# Patient Record
Sex: Female | Born: 1935 | Race: White | Hispanic: No | Marital: Married | Smoking: Never smoker
Health system: Southern US, Community
[De-identification: ages and names within clinical notes are randomized; demographics above are authoritative.]

## PROBLEM LIST (undated history)

## (undated) DIAGNOSIS — F79 Unspecified intellectual disabilities: Secondary | ICD-10-CM

## (undated) DIAGNOSIS — K219 Gastro-esophageal reflux disease without esophagitis: Secondary | ICD-10-CM

## (undated) DIAGNOSIS — E785 Hyperlipidemia, unspecified: Secondary | ICD-10-CM

## (undated) DIAGNOSIS — E119 Type 2 diabetes mellitus without complications: Secondary | ICD-10-CM

## (undated) DIAGNOSIS — I10 Essential (primary) hypertension: Secondary | ICD-10-CM

## (undated) DIAGNOSIS — I2699 Other pulmonary embolism without acute cor pulmonale: Secondary | ICD-10-CM

## (undated) HISTORY — DX: Essential (primary) hypertension: I10

## (undated) HISTORY — DX: Gastro-esophageal reflux disease without esophagitis: K21.9

## (undated) HISTORY — DX: Other pulmonary embolism without acute cor pulmonale: I26.99

## (undated) HISTORY — DX: Type 2 diabetes mellitus without complications: E11.9

## (undated) HISTORY — DX: Unspecified intellectual disabilities: F79

## (undated) HISTORY — DX: Hyperlipidemia, unspecified: E78.5

---

## 2007-12-03 ENCOUNTER — Ambulatory Visit: Payer: Self-pay

## 2009-03-26 ENCOUNTER — Inpatient Hospital Stay: Payer: Self-pay | Admitting: Internal Medicine

## 2009-03-26 ENCOUNTER — Ambulatory Visit: Payer: Self-pay | Admitting: Cardiovascular Disease

## 2009-04-03 ENCOUNTER — Telehealth: Payer: Self-pay | Admitting: Cardiovascular Disease

## 2010-02-20 ENCOUNTER — Inpatient Hospital Stay: Payer: Self-pay | Admitting: Specialist

## 2010-02-23 LAB — PATHOLOGY REPORT

## 2011-03-11 ENCOUNTER — Ambulatory Visit: Payer: Self-pay | Admitting: Cardiovascular Disease

## 2011-05-04 ENCOUNTER — Emergency Department: Payer: Self-pay | Admitting: *Deleted

## 2011-05-16 ENCOUNTER — Emergency Department: Payer: Self-pay | Admitting: Emergency Medicine

## 2011-10-27 ENCOUNTER — Inpatient Hospital Stay: Payer: Self-pay | Admitting: Internal Medicine

## 2011-10-27 DIAGNOSIS — I2699 Other pulmonary embolism without acute cor pulmonale: Secondary | ICD-10-CM

## 2011-10-27 HISTORY — DX: Other pulmonary embolism without acute cor pulmonale: I26.99

## 2011-10-27 LAB — CK TOTAL AND CKMB (NOT AT ARMC): CK, Total: 57 U/L (ref 21–215)

## 2011-10-27 LAB — COMPREHENSIVE METABOLIC PANEL
Albumin: 4.2 g/dL (ref 3.4–5.0)
Alkaline Phosphatase: 53 U/L (ref 50–136)
Anion Gap: 13 (ref 7–16)
Bilirubin,Total: 0.3 mg/dL (ref 0.2–1.0)
Chloride: 96 mmol/L — ABNORMAL LOW (ref 98–107)
Creatinine: 0.7 mg/dL (ref 0.60–1.30)
Glucose: 281 mg/dL — ABNORMAL HIGH (ref 65–99)
Osmolality: 284 (ref 275–301)
SGOT(AST): 22 U/L (ref 15–37)
SGPT (ALT): 19 U/L
Sodium: 137 mmol/L (ref 136–145)
Total Protein: 8.5 g/dL — ABNORMAL HIGH (ref 6.4–8.2)

## 2011-10-27 LAB — APTT
Activated PTT: 28.6 secs (ref 23.6–35.9)
Activated PTT: 88.9 secs — ABNORMAL HIGH (ref 23.6–35.9)

## 2011-10-27 LAB — MAGNESIUM: Magnesium: 1.1 mg/dL — ABNORMAL LOW

## 2011-10-27 LAB — CBC
HCT: 44 % (ref 35.0–47.0)
HGB: 14.9 g/dL (ref 12.0–16.0)
MCV: 86 fL (ref 80–100)
RBC: 5.13 10*6/uL (ref 3.80–5.20)
RDW: 14.8 % — ABNORMAL HIGH (ref 11.5–14.5)

## 2011-10-27 LAB — LIPASE, BLOOD: Lipase: 126 U/L (ref 73–393)

## 2011-10-27 LAB — TROPONIN I: Troponin-I: <0.02 ng/mL

## 2011-10-28 LAB — CK TOTAL AND CKMB (NOT AT ARMC)
CK, Total: 53 U/L (ref 21–215)
CK, Total: 47 U/L (ref 21–215)
CK-MB: 0.9 ng/mL (ref 0.5–3.6)
CK-MB: 0.9 ng/mL (ref 0.5–3.6)

## 2011-10-28 LAB — COMPREHENSIVE METABOLIC PANEL
Alkaline Phosphatase: 38 U/L — ABNORMAL LOW (ref 50–136)
BUN: 11 mg/dL (ref 7–18)
Calcium, Total: 8.6 mg/dL (ref 8.5–10.1)
Co2: 28 mmol/L (ref 21–32)
EGFR (African American): >60
EGFR (Non-African Amer.): >60
Glucose: 76 mg/dL (ref 65–99)
Osmolality: 279 (ref 275–301)
Potassium: 3.5 mmol/L (ref 3.5–5.1)
SGOT(AST): 15 U/L (ref 15–37)
Sodium: 141 mmol/L (ref 136–145)

## 2011-10-28 LAB — CBC WITH DIFFERENTIAL/PLATELET
Basophil %: 0.4 %
Eosinophil #: 0.1 10*3/uL (ref 0.0–0.7)
Eosinophil %: 1.6 %
Lymphocyte %: 27.3 %
MCH: 28.8 pg (ref 26.0–34.0)
MCHC: 33 g/dL (ref 32.0–36.0)
Neutrophil #: 3.4 10*3/uL (ref 1.4–6.5)
Neutrophil %: 61.5 %
RDW: 14.9 % — ABNORMAL HIGH (ref 11.5–14.5)

## 2011-10-28 LAB — APTT: Activated PTT: 89.8 secs — ABNORMAL HIGH (ref 23.6–35.9)

## 2011-10-28 LAB — TROPONIN I: Troponin-I: <0.02 ng/mL

## 2011-11-10 ENCOUNTER — Emergency Department: Payer: Self-pay | Admitting: Emergency Medicine

## 2011-11-10 LAB — PROTIME-INR
INR: 1.9
Prothrombin Time: 21.8 secs — ABNORMAL HIGH (ref 11.5–14.7)

## 2011-11-10 LAB — CBC
HCT: 39.1 % (ref 35.0–47.0)
MCH: 29.1 pg (ref 26.0–34.0)
MCHC: 33.4 g/dL (ref 32.0–36.0)
Platelet: 299 10*3/uL (ref 150–440)
RDW: 14.6 % — ABNORMAL HIGH (ref 11.5–14.5)
WBC: 6.4 10*3/uL (ref 3.6–11.0)

## 2011-11-10 LAB — COMPREHENSIVE METABOLIC PANEL
Albumin: 3.8 g/dL (ref 3.4–5.0)
Alkaline Phosphatase: 73 U/L (ref 50–136)
Anion Gap: 11 (ref 7–16)
BUN: 18 mg/dL (ref 7–18)
Bilirubin,Total: 0.3 mg/dL (ref 0.2–1.0)
Co2: 28 mmol/L (ref 21–32)
Creatinine: 0.55 mg/dL — ABNORMAL LOW (ref 0.60–1.30)
EGFR (African American): >60
Glucose: 160 mg/dL — ABNORMAL HIGH (ref 65–99)
Osmolality: 281 (ref 275–301)
Potassium: 3.6 mmol/L (ref 3.5–5.1)
SGOT(AST): 17 U/L (ref 15–37)
SGPT (ALT): 19 U/L
Total Protein: 7.4 g/dL (ref 6.4–8.2)

## 2011-11-10 LAB — CK TOTAL AND CKMB (NOT AT ARMC): CK-MB: 1.4 ng/mL (ref 0.5–3.6)

## 2011-11-10 LAB — APTT: Activated PTT: 40.3 secs — ABNORMAL HIGH (ref 23.6–35.9)

## 2012-07-25 ENCOUNTER — Emergency Department: Payer: Self-pay

## 2012-07-25 LAB — URINALYSIS, COMPLETE
Bilirubin,UR: NEGATIVE
Hyaline Cast: 5
Ketone: NEGATIVE
Nitrite: NEGATIVE
Ph: 7 (ref 4.5–8.0)
Protein: NEGATIVE
Specific Gravity: 1.016 (ref 1.003–1.030)
Squamous Epithelial: <1
WBC UR: 2 /HPF (ref 0–5)

## 2012-07-25 LAB — CBC
HGB: 13.4 g/dL (ref 12.0–16.0)
MCH: 29 pg (ref 26.0–34.0)
MCHC: 33.9 g/dL (ref 32.0–36.0)
Platelet: 324 10*3/uL (ref 150–440)
RDW: 14.2 % (ref 11.5–14.5)

## 2012-07-25 LAB — CK TOTAL AND CKMB (NOT AT ARMC): CK, Total: 45 U/L (ref 21–215)

## 2012-07-25 LAB — COMPREHENSIVE METABOLIC PANEL
Albumin: 3.9 g/dL (ref 3.4–5.0)
Alkaline Phosphatase: 56 U/L (ref 50–136)
BUN: 12 mg/dL (ref 7–18)
Bilirubin,Total: 0.3 mg/dL (ref 0.2–1.0)
Chloride: 99 mmol/L (ref 98–107)
Creatinine: 0.79 mg/dL (ref 0.60–1.30)
Glucose: 157 mg/dL — ABNORMAL HIGH (ref 65–99)
Osmolality: 281 (ref 275–301)
SGOT(AST): 24 U/L (ref 15–37)
Sodium: 139 mmol/L (ref 136–145)

## 2012-07-25 LAB — TROPONIN I: Troponin-I: <0.02 ng/mL

## 2013-01-12 IMAGING — CR DG CHEST 2V
1 series · 2 of 2 positions shown · non-contrast
Comparison: none

REASON FOR EXAM: chest pain
COMMENTS:

PROCEDURE:     DXR - DXR CHEST PA (OR AP) AND LATERAL  - November 10, 2011 [DATE]
RESULT:     Comparison is made to the prior exam of 10/27/2011.
The lung fields are clear. No pneumonia, pneumothorax or pleural effusion is
seen. The heart size is normal. No acute bony abnormalities are seen.

[Series 1: w chest pa · 0.14mm/px · 2 of 2 slices shown]
[im 1/2]
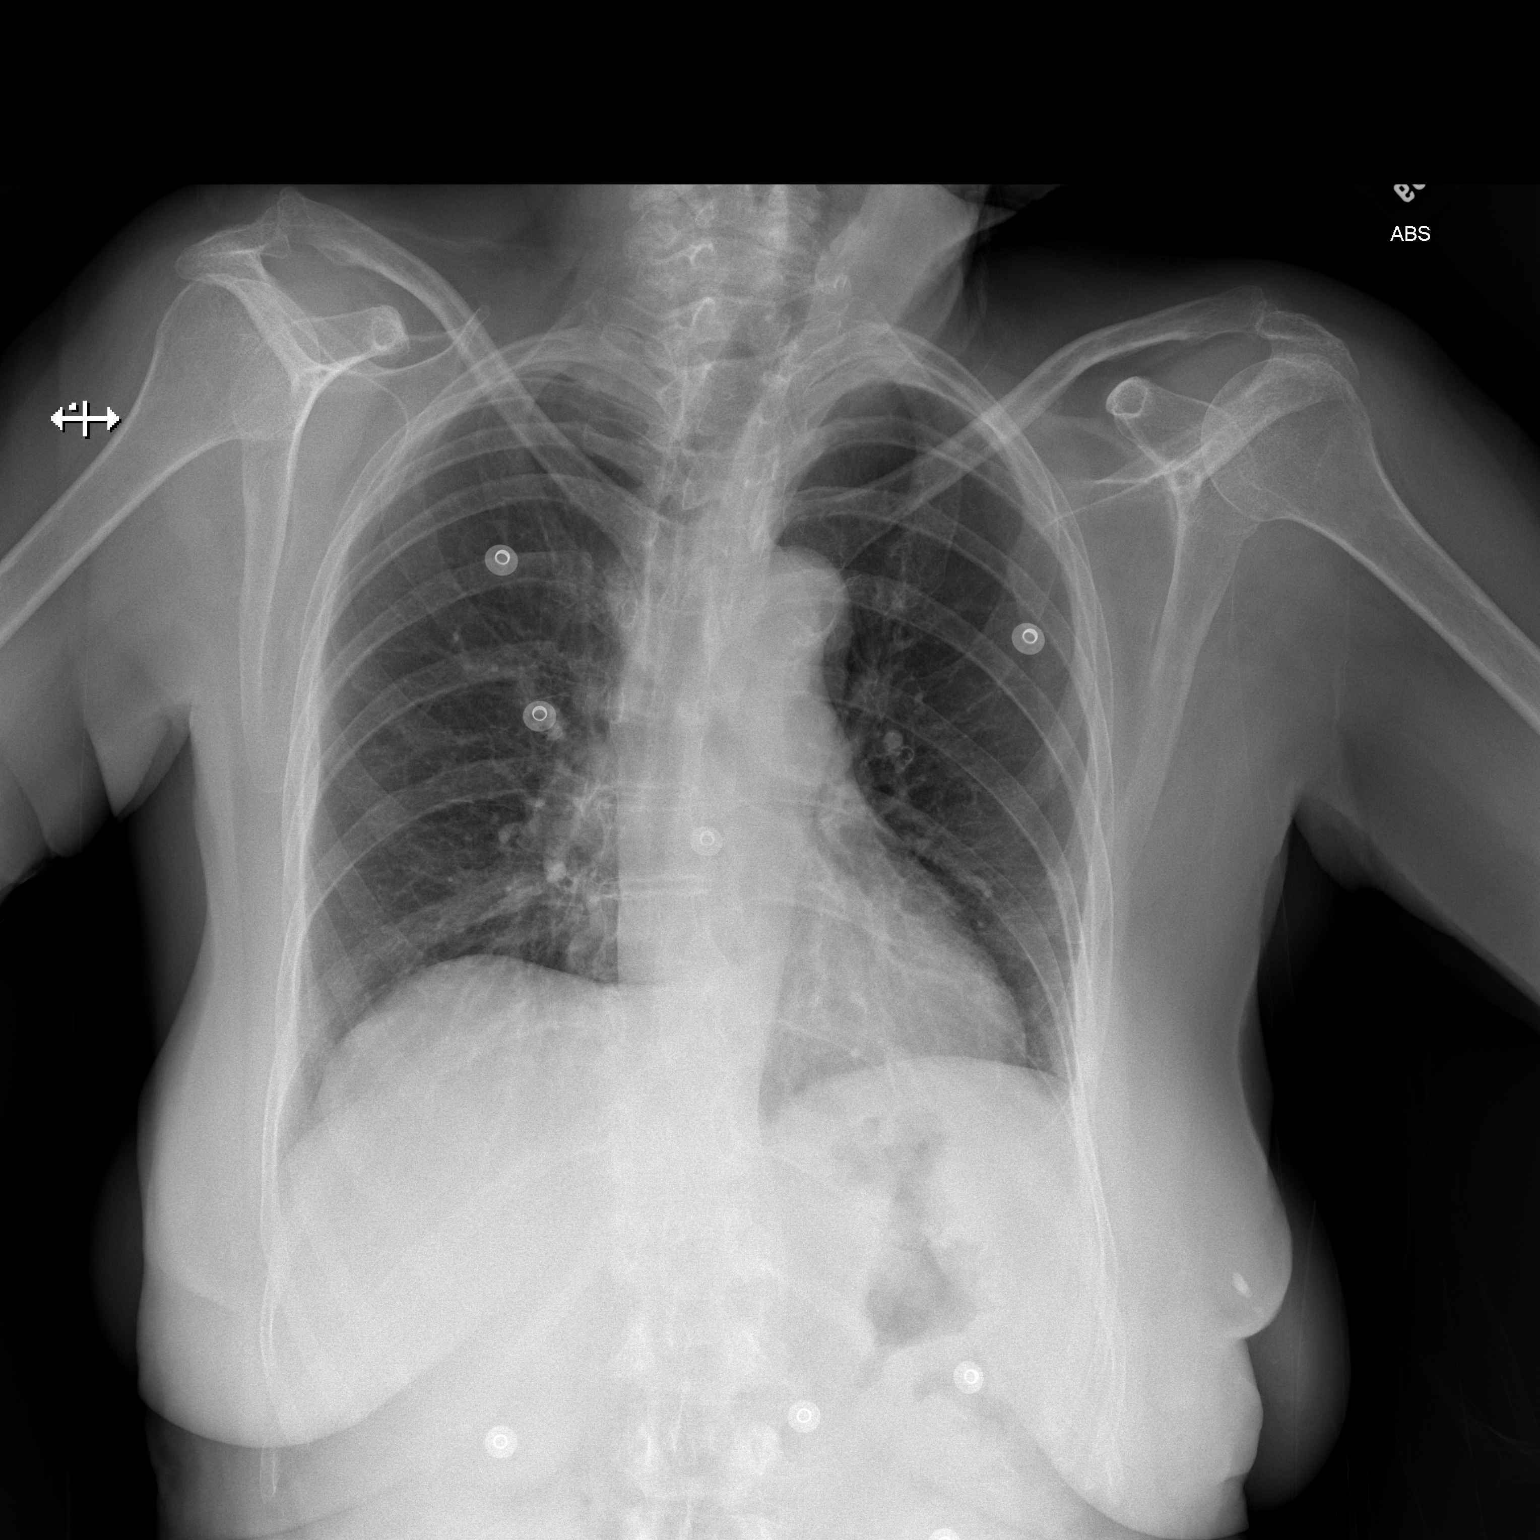
[im 2/2]
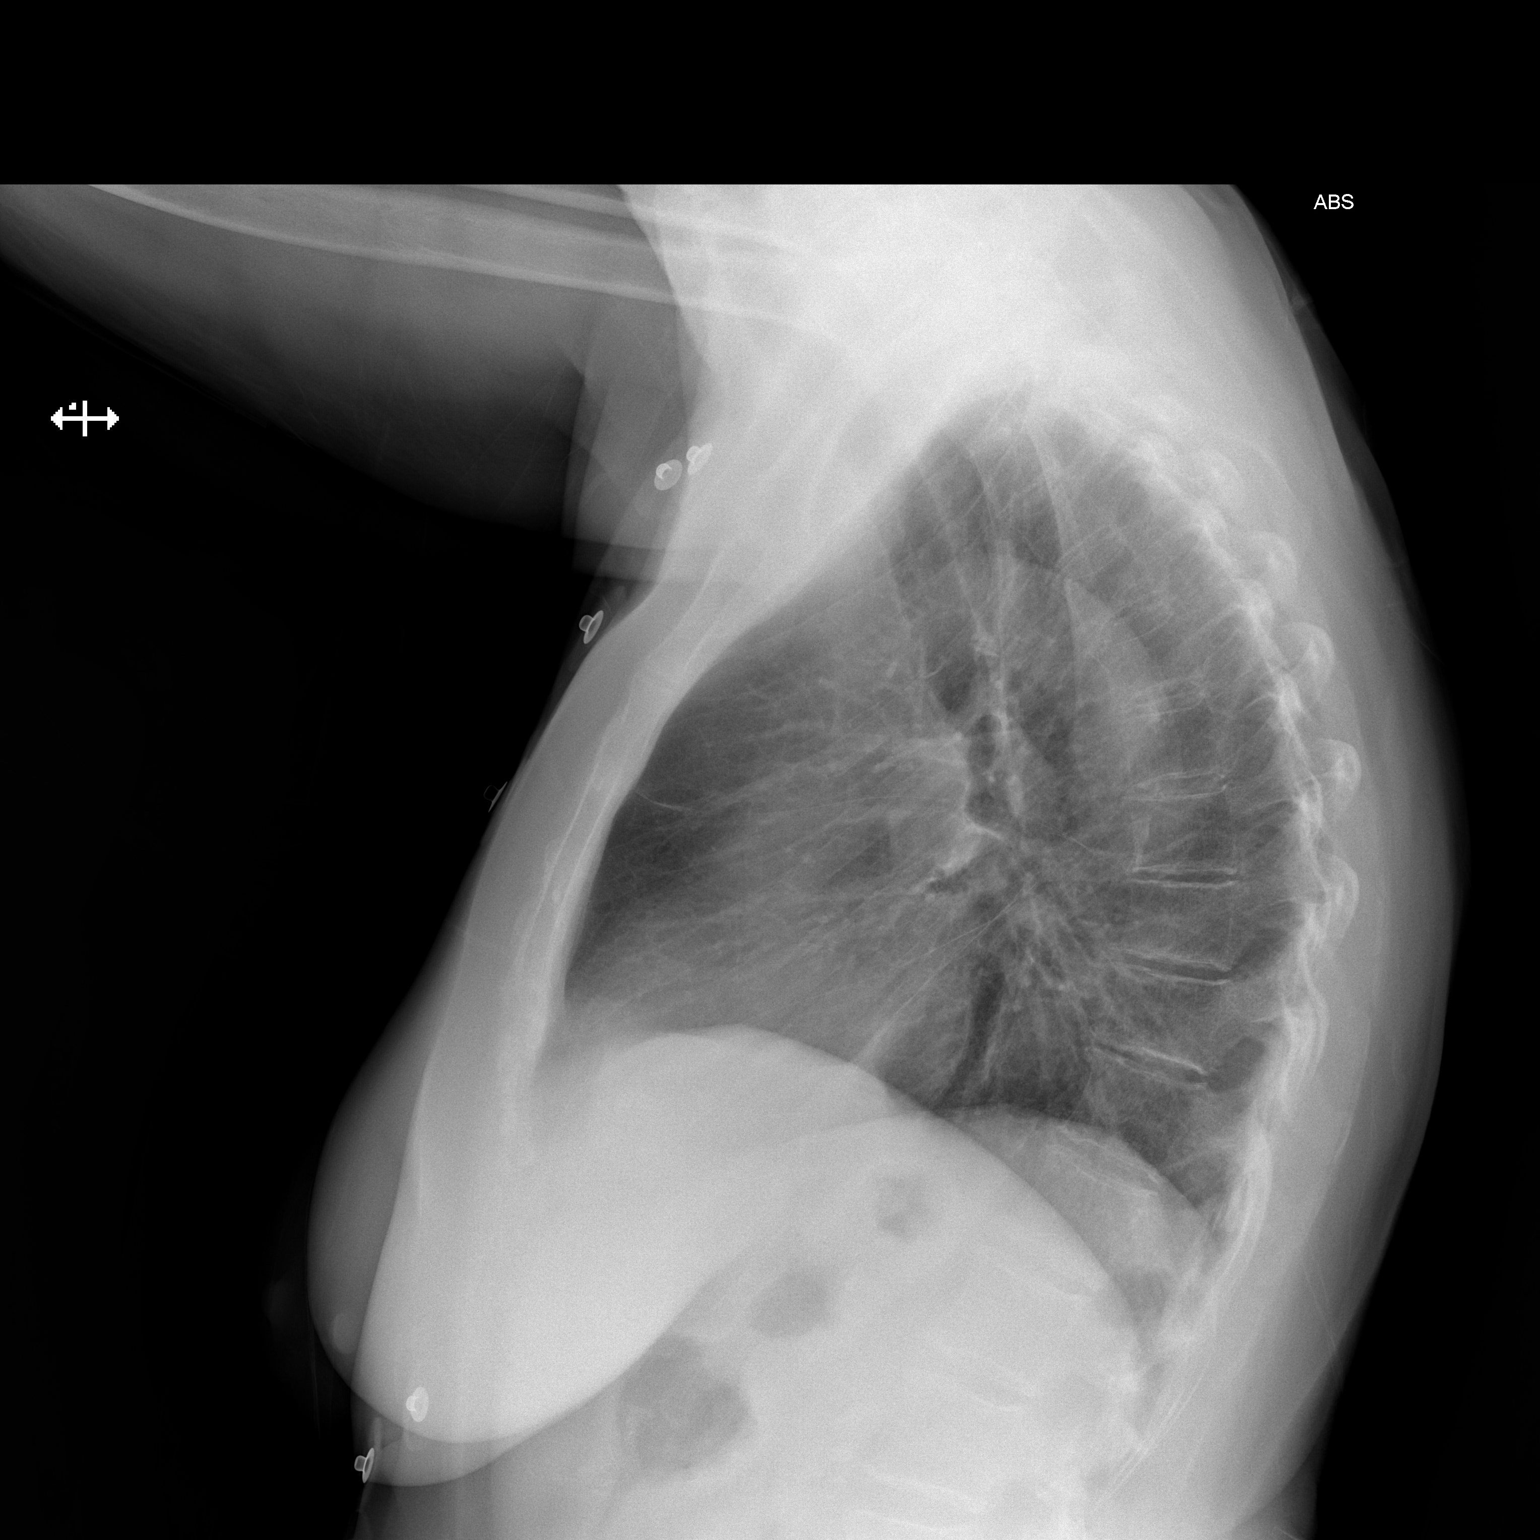

[2 of 2 positions shown; findings below may reference images not displayed]

IMPRESSION: No acute changes are identified.

## 2014-12-07 NOTE — Consult Note (Signed)
Chief Complaint:   Subjective/Chief Complaint Remains pleasently confused. No specific GI symptoms.  Recommendations: Continue bid PPI. Will hold off on EGD due to recent PE and discuss the long term plan with the primary team.   Electronic Signatures: Jill Side (MD)  (Signed 16-Mar-13 12:30)  Authored: Chief Complaint   Last Updated: 16-Mar-13 12:30 by Jill Side (MD)

## 2014-12-07 NOTE — Consult Note (Signed)
PATIENT NAME:  Jennifer Villarreal, Jennifer Villarreal MR#:  [id number] DATE OF BIRTH:  1930-12-10  DATE OF CONSULTATION:  10/28/2011  REFERRING PHYSICIAN:   CONSULTING PHYSICIAN:  Jill Side, MD  REASON FOR CONSULTATION: Abnormal esophagus on CT scan.   HISTORY OF PRESENT ILLNESS: This is a 81 year old female with history of pulmonary embolus in August 2010, hypertension, diabetes, and gastroesophageal reflux disease. She is mentally handicapped. She was admitted to the hospital with chest pain. The patient points towards retrosternal area, as the area of discomfort. Her description is very vague. CT scan of the abdomen was consistent or suggestive of pulmonary embolism and the patient was started on IV heparin and admitted. CT scan also showed some thickening of the wall of the mid to upper esophagus and an endoscopy was recommended. Good history is not available from the patient. She denies dysphagia or odynophagia.   PAST MEDICAL HISTORY:  1. History of hypertension. 2. Type 2 diabetes. 3. History of gastroesophageal reflux disease. 4. Pulmonary embolus in 2010.  5. History of displaced left femoral neck fracture.   HOME MEDICATIONS:  1. Dilantin. 2. Glimepiride. 3. Lorazepam. 4. Metformin. 5. Metoprolol. 6. Omeprazole.   ALLERGIES: None.   SOCIAL HISTORY: She lives at home. Her sister takes care of her.   FAMILY HISTORY: Not available.   REVIEW OF SYSTEMS: Positive for some chest discomfort, otherwise unremarkable.   PHYSICAL EXAMINATION:  GENERAL: Elderly female. She does not appear to be in any acute distress. She appears pleasant and cooperative, but pleasantly demented.    VITAL SIGNS: Pulse 76, temperature 98.7, respirations 20, blood pressure 184/90.    SKIN: A few small skin bruises were noted.   LUNGS: Grossly clear to auscultation bilaterally with fair air entry and no added sounds.   CARDIOVASCULAR: Regular rate and rhythm. No gallops or murmurs were heard.   ABDOMEN: Fairly  benign. Abdomen is soft. No rebound or guarding was noted. No hepatosplenomegaly. Bowel sounds are positive.   EXTREMITIES: No edema.   NEUROLOGIC: Except for her mental confusion, neurological examination appears to be unremarkable.   LABORATORY, DIAGNOSTIC, AND RADIOLOGICAL DATA: White cell count 5.5, hemoglobin 11.7, hematocrit 35.5, platelet count 238. Electrolytes are fairly unremarkable. Liver enzymes are normal. Creatinine 0.56. CT scan as above.   ASSESSMENT AND PLAN: The patient is with pulmonary embolus. CT also showed some thickening of the wall of the proximal and mid esophagus. This is most likely secondary to esophagitis and an esophageal malignancy is unlikely, although possible. I would increase her proton pump inhibitor from once a day to b.i.d. Once the patient's pulmonary status is more stable, we will we will proceed with an upper gastrointestinal endoscopy if agreed by the patient and her caretakers. Currently, the patient has no dysphagia or odynophagia and, therefore, an urgent upper gastrointestinal endoscopy is not required. We will follow and make further recommendations.  ____________________________ Jill Side, MD si:ap D: 10/28/2011 17:29:29 ET            T: 10/29/2011 09:41:12 ET              JOB#: [id number] cc: Jill Side, MD, <Dictator> Jill Side MD ELECTRONICALLY SIGNED 10/29/2011 11:25

## 2014-12-07 NOTE — Discharge Summary (Signed)
PATIENT NAME:  Jennifer Villarreal, Jennifer Villarreal MR#:  [id number] DATE OF BIRTH:  14-Aug-1931  DATE OF ADMISSION:  10/27/2011 DATE OF DISCHARGE:  10/30/2011  PRIMARY CARE PHYSICIAN: Morton Peters., MD    ADMITTING PHYSICIAN: Theodoro Grist, MD  DISCHARGING PHYSICIAN: Judeth Horn. Royden Purl, MD    ADMITTING DIAGNOSIS: Chest pain.   DISCHARGE DIAGNOSES:  1. Chest pain secondary to pulmonary embolus. 2. Mental retardation. 3. Hypertension. 4. Tachycardia from pulmonary embolus.  5. Diabetes.  6. Hypomagnesia. 7. Gastroesophageal reflux disease.  8. Thickening of the esophagus, possible esophagitis.   CONSULTANTS:  1. Case Management.  2. Physical Therapy.  3. Occupational Therapy.  4. Dr. Jill Side.   HOSPITAL LABORATORY, DIAGNOSTIC AND RADIOLOGICAL DATA:  Chest x-ray on 10/27/2011 showed no acute changes identified.  CT scan of the chest with contrast 10/27/2011 showed evaluation of segmental pulmonary arteries was limited by respiratory motion. However, there were findings consistent with small eccentric pulmonary embolus in the anterior left upper lobe segmental pulmonary, circumferential thickening of the proximal mid thoracic esophagus, nonspecific, and may be related to esophagitis.  Lower extremity Dopplers on 10/27/2010 showed no deep venous thrombosis identified in either side.  Echo Doppler 10/28/2011 done by Dr. Serafina Royals showed left ventricular systolic function is normal. Ejection fraction 55 %. There is mild mitral regurgitation. There is mild tricuspid regurgitation.   HOSPITAL COURSE: Initial History and Physical were done by Dr. Theodoro Grist.  Please refer to her note dated 10/27/2011 for complete details. In brief, the patient is a 81 year old white female with past medical history of pulmonary embolism in August 2010, history of hypertension, diabetes, as well as gastroesophageal reflux disease, who is mentally handicapped, presents with chest pain. The patient was found to  have pulmonary embolus on CT scan of the chest and was admitted to the Hospitalist Service.   1. Chest pain: Most likely this was secondary to pulmonary embolism. She was monitored on telemetry. She had no troponin leak. She was started on heparin nomogram and was eventually converted over to Xarelto. We confirmed with Case Management that her payments for Xarelto monthly would be $3.50, thus, we started her on this.  2. History of hypertension: We continued outpatient medications in the form of metoprolol, Diovan and hydrochlorothiazide.  3. Sinus tachycardia: This is was most likely secondary to pulmonary embolism. She had three sets of cardiac enzymes which showed no troponin leak. She was on metoprolol, which her symptoms eventually resolved.  4. Diabetes: We held her metformin considering CT scan. We continued her on glimepiride and sliding scale insulin. We resumed all of her home medications for diabetes.  5. Hypomagnesia: This was supplemented and repleted.  6. Gastroesophageal reflux disease with thickening of the esophagus: Likely esophagitis. We continued her PPI and increased it b.i.d.  She will need outpatient followup and possibly EGD by Dr. Dionne Milo in the future. She will follow up with him as an outpatient.  7. Deep venous thrombosis prophylaxis: This was maintained with heparin nomogram and was converted over to Xarelto.  8. The patient was seen by PT/OT and recommended for Home Health.   CONDITION ON DISCHARGE: She was discharged on 10/30/2011. VITAL SIGNS: Temperature 97.9, heart rate 69, respirations 20, blood pressure 125/77, saturating 94% on 2 liters. LUNGS: Clear to auscultation. CARDIOVASCULAR: Regular rate and rhythm. ABDOMEN: Benign.     DISCHARGE MEDICATIONS:  1. Diovan 40 mg daily.  2. Glimepiride 4 mg b.i.d.   3. Lorazepam 1 tablet b.i.d.   4.  Metoprolol ER 50 mg once a day. 5. Aspirin 81 mg daily. 6. Metformin 500 mg p.o. b.i.d. with meals.  7. Omeprazole 20 mg  p.o. b.i.d.  8. Xarelto 15 mg p.o. b.i.d. for 21 days, then Xarelto 20 mg daily.   DIET: Low sodium, ADA diet.   ACTIVITY: As tolerated, but be careful of falls. Home PT evaluation.   FOLLOWUP:  1. The patient is to see Dr. Laurian Brim, her primary care physician, in 1 week.  2. The patient is to see Dr. Dionne Milo in 3 weeks.  3. She is to get Home Health, Home Physical Therapy.   CODE STATUS:  FULL CODE.    TOTAL TIME SPENT ON DISCHARGE: 45 minutes.  ____________________________ Judeth Horn Royden Purl, MD aaf:cbb D: 10/30/2011 11:22:49 ET T: 10/31/2011 13:00:59 ET JOB#: [id number]  cc: Mike Craze A. Royden Purl, MD, <Dictator> Jill Side, MD Morton Peters., MD Mike Craze Cassell Clement MD ELECTRONICALLY SIGNED 10/31/2011 17:55

## 2014-12-07 NOTE — H&P (Signed)
PATIENT NAME:  Jennifer Villarreal, Jennifer Villarreal MR#:  [id number] DATE OF BIRTH:  01/26/1931  DATE OF ADMISSION:  10/27/2011  PRIMARY CARE PHYSICIAN: Dr. Laurian Brim.   HISTORY OF PRESENT ILLNESS: The patient is a 81 year old Caucasian female with past medical history significant for history of pulmonary embolism in August 2010, history of hypertension, diabetes, as well as gastroesophageal reflux disease, also mentally handicapped, who presented to the hospital with complaints of chest pain. According to the patient's sister, who is present during my interview, as well as the patient herself, she started having chest pain last night while at rest in the lower part of her midsternal area. She was having this pain this morning and because Dr. Laurian Brim is not working today, the patient's sister decided to come to take the patient to the Emergency Room. In the Emergency Room, she had a CT scan of her chest which was suggestive for pulmonary embolism and hospitalist services were contacted for admission. The patient herself is not able to provide much more history. Admits of having chest pains which she tells me is hard. She admits of having pain increasing whenever she breathes, but denies any shortness of breath.   PAST MEDICAL HISTORY:  1. Hypertension. 2. Diabetes mellitus type 2 non-insulin-dependent. 3. History of gastroesophageal reflux disease. 4. History of pulmonary embolism in August 2010. 5. History of displaced left femoral neck fracture in July 2011 status post prosthetic left femoral head replacement with AT Laurance Flatten prosthesis by Dr. Christophe Louis.  6. History of being mentally handicapped.   MEDICATIONS:  1. Dilantin 40 mg p.o. daily.  2. Glimepiride 4 mg twice daily. 3. Lorazepam 1 mg twice daily.  4. Metformin 500 mg p.o. twice daily. 5. Metoprolol succinate XL 50 mg p.o. daily. 6. Omeprazole 10 mg p.o. daily.   PAST SURGICAL HISTORY: As above.   ALLERGIES: No known drug allergies.    SOCIAL HISTORY: The patient lives at home with her sister who has been taking care of her for the past 29 years. No history of smoking or alcohol abuse. She is single. No kids.   FAMILY HISTORY: Not known as the patient was adopted.   REVIEW OF SYSTEMS: Positive for pains in her chest, cataracts for which she uses glasses. The patient cannot walk without a walker since surgery. She admits of palpitations in her chest as well as intermittent diarrhea and dysuria since surgery. According to the patient's history she has high risk of falls. The patient falls very frequently. She fell down out of chair in September or October, was brought here to the Emergency Room with her head cut which was sutured, but no recent falls now. Very frequently complains of pains in her hips and tells everyone that she cannot walk and uses walker to walk because of previous left hip surgery. Otherwise, denies fevers, chills, fatigue, weakness, weight loss or gain. In regards to eyes, no blurry vision, double vision, or glaucoma. ENT: Denies any tinnitus, allergies, epistaxis, sinus pain, dentures, or difficulty swallowing. RESPIRATORY: Denies any cough, wheezes, asthma, or chronic obstructive pulmonary disease. CARDIOVASCULAR: Admits of chest pains. Denies orthopnea, edema, arrhythmias, or syncope. GASTROINTESTINAL: Denies any nausea, vomiting, rectal bleeding, change in bowel habits. GENITOURINARY: Denies hematuria, frequency or incontinence. ENDOCRINE: Denies any polydipsia, nocturia, thyroid problems, heat or cold intolerance, or thirst. HEMATOLOGIC: No anemia, bruising, bleeding, or swollen glands. SKIN: Denies any acne, rashes, lesions, or change in moles. MUSCULOSKELETAL: Denies arthritis, cramps, swelling, or gout. NEUROLOGIC: No numbness, epilepsy, or tremor.  PSYCHIATRY: No anxiety, insomnia, or depression.   PHYSICAL EXAMINATION:  VITAL SIGNS: On arrival to the hospital, temperature 98, pulse 150, respirations 20,  blood pressure 145/99, saturation 96% on room air.   GENERAL: This is a well-developed, well-nourished Caucasian female in no significant distress lying on the stretcher.   HEENT: Her pupils are equal and reactive to light. Extraocular movements intact. No icterus or conjunctivitis. Has normal hearing. No pharyngeal erythema. Mucosa is dry.   NECK: Neck did not reveal any masses, supple, nontender. Thyroid is not enlarged. No adenopathy. No jugular venous distention or carotid bruits bilaterally. Full range of motion.   LUNGS: Clear to auscultation in all fields. No significant rales, rhonchi, diminished breath sounds or wheezing. No labored inspirations, increased effort, dullness to percussion, or overt respiratory distress.   CARDIOVASCULAR: S1, S2 appreciated. No murmurs, gallops, or rubs were noted, tachycardic. PMI not enlarged. Chest is nontender to palpation.   EXTREMITIES: 1+ pedal pulses. No lower extremity edema, calf tenderness, or cyanosis noted.   ABDOMEN: Soft, nontender. Bowel sounds are present. No hepatosplenomegaly or masses are noted.   RECTAL: Deferred. The patient had some mild discomfort in the epigastric area, but no rebound or guarding was noted. Muscle strength five over five in all four extremities. No cyanosis, degenerative joint disease, or kyphosis. Gait is not tested.   SKIN: Skin did not reveal any rashes, lesions, erythema, nodularity, or induration. The patient has a one plaque-like form skin change in the right lower extremity, anterior shin, which according to the patient's sister, is related to her diabetes mellitus. The patient was seen by a dermatologist in the past and that was negative for malignancy. No other abnormalities were found.   LYMPH: No adenopathy in the cervical region.   NEUROLOGIC: Cranial nerves grossly intact. Sensory is intact. No dysarthria or aphasia. The patient is alert, difficult to assess her orientation because of mental handicap  status. She is cooperative, but difficult to assess her. She is not able to provide much history.   LABORATORY, DIAGNOSTIC AND RADIOLOGICAL DATA: BMP showed glucose 281, otherwise unremarkable BMP. Magnesium level is low at 1.1. Lipase level 126. Total protein is 8.5, otherwise liver enzymes were normal. Cardiac enzymes were normal. CBC within normal limits. Coagulation panel is unremarkable. D-dimer is slightly elevated at 0.98. The patient had chest x-ray done, portable single view 10/27/2011 which showed no acute changes. CT of chest with contrast 10/27/2011 revealed segmental pulmonary arteries limitation by respiratory motion. However, findings are concerning for small eccentric pulmonary embolus in anterior left upper lobe segmental pulmonary artery. Circumferential thickening of the proximal and mid thoracic esophagus is nonspecific which may be related to esophagitis. However, this is nonspecific as well as endoscopy suggested. EKG showed sinus tachycardia at 149 beats per minute. Minimal voltage criteria for left ventricular hypertrophy, may be normal variant according to EKG criteria. No acute ST-T changes, however, were noted.   ASSESSMENT AND PLAN:  1. Pulmonary embolus. Admit the patient to the medical floor. Start her on heparin IV. The patient would benefit from Xarelto, discharge on Xarelto, which would lessen social impaction to getting to primary care physician to check her ProTime frequently. Will get also social workers involved for discharge planning.  2. History of hypertension. Continue outpatient medications. Advance metoprolol to 25 mg every six hours.  3. Tachycardia, sinus, very likely related to pulmonary embolus. We will continue metoprolol. Check cardiac enzymes x3.  4. Diabetes mellitus. We will hold metformin and will continue  Glimepiride as well as sliding scale insulin. Will get hemoglobin A1c.  5. Hypomagnesemia, supplement IV.  6. History of gastroesophageal reflux  disease with thickening of the esophagus, questionable cancer. Will get gastroenterologist involved for possible esophagogastroduodenoscopy as the patient has pulmonary embolism and we need to rule out cancer.   TIME SPENT: One hour.    ____________________________ Theodoro Grist, MD rv:ap D: 10/27/2011 16:07:42 ET T: 10/27/2011 16:44:39 ET JOB#: [id number]  cc: Theodoro Grist, MD, <Dictator> Morton Peters., MD [place] MD ELECTRONICALLY SIGNED 11/05/2011 10:07

## 2015-07-07 ENCOUNTER — Encounter: Payer: Medicare Other | Attending: Surgery | Admitting: Surgery

## 2015-07-07 ENCOUNTER — Other Ambulatory Visit
Admission: RE | Admit: 2015-07-07 | Discharge: 2015-07-07 | Disposition: A | Discharge status: Home / Self Care | Payer: Medicare Other | Source: Ambulatory Visit | Attending: Surgery | Admitting: Surgery

## 2015-07-07 DIAGNOSIS — F819 Developmental disorder of scholastic skills, unspecified: Secondary | ICD-10-CM | POA: Diagnosis not present

## 2015-07-07 DIAGNOSIS — I10 Essential (primary) hypertension: Secondary | ICD-10-CM | POA: Insufficient documentation

## 2015-07-07 DIAGNOSIS — F0390 Unspecified dementia without behavioral disturbance: Secondary | ICD-10-CM | POA: Diagnosis not present

## 2015-07-07 DIAGNOSIS — L97212 Non-pressure chronic ulcer of right calf with fat layer exposed: Secondary | ICD-10-CM | POA: Insufficient documentation

## 2015-07-07 DIAGNOSIS — E11622 Type 2 diabetes mellitus with other skin ulcer: Secondary | ICD-10-CM | POA: Insufficient documentation

## 2015-07-07 DIAGNOSIS — Z794 Long term (current) use of insulin: Secondary | ICD-10-CM | POA: Insufficient documentation

## 2015-07-08 NOTE — Progress Notes (Signed)
Jennifer Villarreal, Jennifer Villarreal ([id number]) Visit Report for 07/07/2015 Biopsy Details Patient Name: Jennifer Villarreal, Jennifer Villarreal 07/07/2015 12:45 Date of Service: PM Medical Record [id number] Number: Patient Account Number: [id number] [birth year] (81 y.o. Treating RN: Montey Hora Date of Birth/Sex: Female) Other Clinician: Primary Care Physician: Threasa Alpha Treating Christin Fudge Referring Physician: Threasa Alpha Physician/Extender: Weeks in Treatment: 0 Biopsy Performed for: Wound #1 Right, Anterior Lower Leg Location(s): Wound Margin Performed By: Physician Christin Fudge, MD Tissue Punch: No Number of Specimens Taken: 2 Specimen Sent To Pathology: Yes Time-Out Taken: Yes Pain Control: Lidocaine Injectable Lidocaine Percent: 2% Instrument: Blade Bleeding: Moderate Hemostasis Achieved: Silver Nitrate Procedural Pain: 0 Post Procedural Pain: 0 Response to Treatment: Procedure was tolerated well Post Procedure Diagnosis Same as Pre-procedure Electronic Signature(s) Signed: 07/07/2015 2:42:37 PM By: Christin Fudge MD, FACS Entered By: Christin Fudge on 07/07/2015 14:42:36 Dacosta, Marta L. ([id number]) -------------------------------------------------------------------------------- Chief Complaint Document Details Patient Name: Jennifer Villarreal, Jennifer Villarreal 07/07/2015 12:45 Date of Service: PM Medical Record [id number] Number: Patient Account Number: [id number] Dec 28, 1930 (81 y.o. Treating RN: Montey Hora Date of Birth/Sex: Female) Other Clinician: Primary Care Physician: Threasa Alpha Treating Christin Fudge Referring Physician: Threasa Alpha Physician/Extender: Weeks in Treatment: 0 Information Obtained from: Caregiver Chief Complaint Patient presents to the wound care center for a consult due non healing wound. this 81 year old patient has mental retardation and is looked after by her sister who is her caregiver. I understand this has been an open wound for about 10 years now. Electronic  Signature(s) Signed: 07/07/2015 2:43:18 PM By: Christin Fudge MD, FACS Entered By: Christin Fudge on 07/07/2015 14:43:18 Jennifer Villarreal, Jennifer Villarreal Kitchen ([id number]) -------------------------------------------------------------------------------- HPI Details Patient Name: Jennifer Villarreal, Jennifer Villarreal 07/07/2015 12:45 Date of Service: PM Medical Record [id number] Number: Patient Account Number: [id number] September 21, 1930 (81 y.o. Treating RN: Montey Hora Date of Birth/Sex: Female) Other Clinician: Primary Care Physician: Threasa Alpha Treating Christin Fudge Referring Physician: Threasa Alpha Physician/Extender: Weeks in Treatment: 0 History of Present Illness Location: painless wound to the right lower extremity Quality: Patient reports No Pain. Severity: Patient states wound are getting worse. Duration: Patient states that they are not certain how long the wound has been present, but they feel it has been there for over 10 years Context: The wound appeared gradually over time Modifying Factors: Other treatment(s) tried include:Silvadene Associated Signs and Symptoms: Patient reports having difficulty standing for long periods. HPI Description: An 80 year old patient has been sent to [place] by her PCP at [facility] family practice Dr. 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 There is a history of venous ulceration in the past. I understand a venous duplex of the right lower extremity was done which showed no DVT. the patient was put on Bactrim DS and Silvadene ointment. PAST MEDICAL HISTORY: 1.oooHypertension.2.oDiabetes mellitus type 2 .3.History of gastroesophageal reflux disease.4.oHistory of pulmonary embolism in August 2010 5.oHistory of displaced left femoral neck fracture in July 2011 status post prosthetic left femoral head replacement with AT Laurance Flatten prosthesis by Dr. Christophe Louis. o6.oHistory of being mentally handicapped. o most recent  hemoglobin A1c done in October 2016 was 7.1 Electronic Signature(s) Signed: 07/07/2015 2:44:40 PM By: Christin Fudge MD, FACS Previous Signature: 07/07/2015 1:45:35 PM Version By: Christin Fudge MD, FACS Previous Signature: 07/07/2015 1:38:44 PM Version By: Christin Fudge MD, FACS Entered By: Christin Fudge on 07/07/2015 14:44:39 Jennifer Villarreal, Jennifer L. ([id number]) -------------------------------------------------------------------------------- Physical Exam Details Patient Name: Jennifer Villarreal, Jennifer Villarreal 07/07/2015 12:45 Date of Service: PM Medical Record [id number] Number: Patient  Account Number: [id number] 1930-10-24 (81 y.o. Treating RN: Montey Hora Date of Birth/Sex: Female) Other Clinician: Primary Care Physician: Threasa Alpha Treating Chritopher Coster Referring Physician: Threasa Alpha Physician/Extender: Weeks in Treatment: 0 Constitutional . Pulse regular. Respirations normal and unlabored. Afebrile. . Eyes Nonicteric. Reactive to light. Ears, Nose, Mouth, and Throat Lips, teeth, and gums WNL.Marland Kitchen Moist mucosa without lesions . Neck supple and nontender. No palpable supraclavicular or cervical adenopathy. Normal sized without goiter. Respiratory WNL. No retractions.. Cardiovascular Pedal Pulses WNL. ABI on the left is 0.82 on the right is 0.67. No clubbing, cyanosis or edema. Lymphatic No adneopathy. No adenopathy. No adenopathy. Musculoskeletal Adexa without tenderness or enlargement.. Digits and nails w/o clubbing, cyanosis, infection, petechiae, ischemia, or inflammatory conditions.. Integumentary (Hair, Skin) No suspicious lesions. No crepitus or fluctuance. No peri-wound warmth or erythema. No masses.Marland Kitchen Psychiatric Judgement and insight Intact.. No evidence of depression, anxiety, or agitation.. Notes there is an open ulcerated area with slightly hyper granulation of the ulceration with fairly smooth the edges and no evidence of infection surrounding it. Due to the fact that it  has been there for about 10 years I definitely think a biopsy should be done. Electronic Signature(s) Signed: 07/07/2015 2:45:47 PM By: Christin Fudge MD, FACS Entered By: Christin Fudge on 07/07/2015 14:45:47 Paradis, Javanna Carlean Jews ([id number]) -------------------------------------------------------------------------------- Physician Orders Details Patient Name: Jennifer Villarreal, Jennifer Villarreal 07/07/2015 12:45 Date of Service: PM Medical Record [id number] Number: Patient Account Number: [id number] [birth year] (81 y.o. Treating RN: Montey Hora Date of Birth/Sex: Female) Other Clinician: Primary Care Physician: Threasa Alpha Treating Legacy Lacivita Referring Physician: Threasa Alpha Physician/Extender: [place] in Treatment: 0 Verbal / Phone Orders: Yes Clinician: Montey Hora Read Back and Verified: Yes Diagnosis Coding Wound Cleansing Wound #1 Right,Anterior Lower Leg o Clean wound with Normal Saline. Anesthetic Wound #1 Right,Anterior Lower Leg o Topical Lidocaine 4% cream applied to wound bed prior to debridement o Injected 2% Lidocaine without epinephrine prior to debridement - for biopsy Skin Barriers/Peri-Wound Care Wound #1 Right,Anterior Lower Leg o Skin Prep Primary Wound Dressing Wound #1 Right,Anterior Lower Leg o Other: - siltec sorbact - item number [id number] Dressing Change Frequency Wound #1 Right,Anterior Lower Leg o Change Dressing Monday, Wednesday, Friday Follow-up Appointments Wound #1 Right,Anterior Lower Leg o Return Appointment in 1 week. Home Health Wound #1 [place] Visits o Home Health Nurse may visit PRN to address patientos wound care needs. o FACE TO FACE ENCOUNTER: MEDICARE and MEDICAID PATIENTS: I certify that this patient is under my care and that I had a face-to-face encounter that meets the physician face-to-face encounter requirements with this patient on this date. The encounter with the patient  was in whole or in part for the following MEDICAL CONDITION: (primary reason for [place]) Jennifer Villarreal, Jennifer Villarreal ([id number]) MEDICAL NECESSITY: I certify, that based on my findings, NURSING services are a medically necessary home health service. HOME BOUND STATUS: I certify that my clinical findings support that this patient is homebound (i.e., Due to illness or injury, pt requires aid of supportive devices such as crutches, cane, wheelchairs, walkers, the use of special transportation or the assistance of another person to leave their place of residence. There is a normal inability to leave the home and doing so requires considerable and taxing effort. Other absences are for medical reasons / religious services and are infrequent or of short duration when for other reasons). o If current dressing causes regression in wound condition, may D/C ordered dressing product/s and  apply Normal Saline Moist Dressing daily until next [place] / Other MD appointment. [place] of regression in wound condition at [phone]. o Please direct any NON-WOUND related issues/requests for orders to patient's Primary Care Physician Laboratory o Biopsy for Pathology - right lower leg oooo Electronic Signature(s) Signed: 07/07/2015 4:47:37 PM By: Christin Fudge MD, FACS Signed: 07/07/2015 5:17:47 PM By: Montey Hora Entered By: Montey Hora on 07/07/2015 14:16:20 Jennifer Villarreal, Jennifer L. ([id number]) -------------------------------------------------------------------------------- Problem List Details Patient Name: STACY, ATAYDE 07/07/2015 12:45 Date of Service: PM Medical Record [id number] Number: Patient Account Number: [id number] [birth year] (81 y.o. 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 Treating RN: Montey Hora Date of Birth/Sex: Female) Other Clinician: Primary Care Physician: Threasa Alpha Treating Christin Fudge Referring Physician: Threasa Alpha Physician/Extender: Weeks in Treatment: 0 Subjective Chief Complaint Information obtained from Caregiver Patient presents to the wound care center for a consult due non healing wound. this 81 year old patient has mental retardation and is looked after by her sister who is her caregiver. I understand this has been an open wound for about 10 years now. History of Present Illness (HPI) The following HPI elements were documented for the patient's wound: Location: painless wound to the right lower extremity Quality: Patient reports No Pain. Severity: Patient states wound are getting worse. Duration: Patient states that they are not certain how long the wound has been present, but they feel it has been there for over 10 years Context: The wound appeared gradually over time Modifying Factors: Other treatment(s) tried include:Silvadene Associated Signs and Symptoms: Patient reports having difficulty standing for long periods. An 81 year old patient has been sent  to [place] by her PCP at [facility] family practice Dr. 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 There is a history of venous ulceration in the past. I understand a venous duplex of the right lower extremity was done which showed no DVT. the patient was put on Bactrim DS and Silvadene ointment. PAST MEDICAL HISTORY: 1. Hypertension.2. Diabetes mellitus type 2 .3.History of gastroesophageal reflux disease.4. History of pulmonary embolism in August 2010 5. History of displaced left femoral neck fracture in July 2011 status post prosthetic left femoral head replacement with AT Laurance Flatten prosthesis by Dr. Christophe Louis. 6. History of being mentally handicapped. most recent hemoglobin A1c done in October 2016 was 7.1 Wound History Patient presents with 1 open wound that has been present for approximately 10 years. Patient has been treating wound in the following manner: silvadene. The wound has been healed in the past but has re- opened. Laboratory tests have not been performed in the last month. Patient reportedly has not tested positive for an antibiotic resistant organism. Patient reportedly has not tested positive for osteomyelitis. Patient reportedly has not had testing performed to  evaluate circulation in the legs. KARLEY, ASFOUR ([id number]) Patient History Information obtained from Caregiver. Allergies No Known Allergies Social History Never smoker, Marital Status - Single, Alcohol Use - Never, Drug Use - No History, Caffeine Use - Never. Medical History Cardiovascular Patient has history of Hypertension Endocrine Patient has history of Type II Diabetes Neurologic Patient has history of Dementia Oncologic Denies history of Received Chemotherapy, Received Radiation Patient is treated with Insulin, Oral Agents. Blood sugar is not tested. Medical And Surgical History Notes Cardiovascular tachycardia Neurologic low IQ with  developmental delays Review of Systems (ROS) Constitutional Symptoms (General Health) The patient has no complaints or symptoms. Eyes The patient has no complaints or symptoms. Ear/Nose/Mouth/Throat The patient has no complaints or symptoms. Hematologic/Lymphatic The patient has no complaints or symptoms. Respiratory The patient has no complaints or symptoms. Gastrointestinal The patient has no complaints or symptoms. Genitourinary The patient has no complaints or symptoms. Immunological The patient has no complaints or symptoms. Integumentary (Skin) The patient has no complaints or symptoms. Musculoskeletal The patient has no complaints or symptoms. Neurologic The patient has no complaints or symptoms. Neddo, Gibson ([id number]) Oncologic The patient has no complaints or symptoms. Psychiatric The patient has no complaints or symptoms. Objective Constitutional Pulse regular. Respirations normal and unlabored. Afebrile. Vitals Time Taken: 1:32 PM, Height: 63 in, Source: Stated, Weight: 129 lbs, Source: Stated, BMI: 22.8, Temperature: 98.1 F, Pulse: 74 bpm, Respiratory Rate: 18 breaths/min, Blood Pressure: 119/74 mmHg. Eyes Nonicteric. Reactive to light. Ears, Nose, Mouth, and Throat Lips, teeth, and gums WNL.Marland Kitchen Moist mucosa without lesions . Neck supple and nontender. No palpable supraclavicular or cervical adenopathy. Normal sized without goiter. Respiratory WNL. No retractions.. Cardiovascular Pedal Pulses WNL. ABI on the left is 0.82 on the right is 0.67. No clubbing, cyanosis or edema. Lymphatic No adneopathy. No adenopathy. No adenopathy. Musculoskeletal Adexa without tenderness or enlargement.. Digits and nails w/o clubbing, cyanosis, infection, petechiae, ischemia, or inflammatory conditions.Marland Kitchen Psychiatric Judgement and insight Intact.. No evidence of depression, anxiety, or agitation.. General Notes: there is an open ulcerated area with slightly hyper  granulation of the ulceration with fairly smooth the edges and no evidence of infection surrounding it. Due to the fact that it has been there for about 10 years I definitely think a biopsy should be done. Integumentary (Hair, Skin) Vallejo, Alleen L. ([id number]) No suspicious lesions. No crepitus or fluctuance. No peri-wound warmth or erythema. No masses.. Wound #1 status is Open. Original cause of wound was Gradually Appeared. The wound is located on the Right,Anterior Lower Leg. The wound measures 2.7cm length x 1.7cm width x 0.1cm depth; 3.605cm^2 area and 0.36cm^3 volume. The wound is limited to skin breakdown. There is no tunneling or undermining noted. There is a medium amount of serous drainage noted. The wound margin is flat and intact. There is large (67-100%) red granulation within the wound bed. There is no necrotic tissue within the wound bed. The periwound skin appearance did not exhibit: Callus, Crepitus, Excoriation, Fluctuance, Friable, Induration, Localized Edema, Rash, Scarring, Dry/Scaly, Maceration, Moist, Atrophie Blanche, Cyanosis, Ecchymosis, Hemosiderin Staining, Mottled, Pallor, Rubor, Erythema. The periwound has tenderness on palpation. Assessment Active Problems ICD-10 E11.622 - Type 2 diabetes mellitus with other skin ulcer L97.212 - Non-pressure chronic ulcer of right calf with fat layer exposed F81.9 - Developmental disorder of scholastic skills, unspecified This elderly patient has a elderly sister who is her caregiver and if the history is right has a open ulcerated wound to the right lower extremity  for about 10 years. I have done a wedge biopsy of the edges of the wound and will review this with the pathologist. I have recommended Siltec Sorbact to be applied to the wound and changed about twice a week. She will come back and see as next week. Procedures Wound #1 Wound #1 is a Diabetic Wound/Ulcer of the Lower Extremity located on the Right, Anterior Lower  Leg . There was a biopsy performed by Christin Fudge, MD. There was a biopsy performed on Wound Margin. The skin was cleansed and prepped with anti-septic followed by pain control using Lidocaine Injectable: 2%. Tissue was removed at its base with the following instrument(s): Blade and sent to pathology. A Moderate amount of bleeding was controlled with Silver Nitrate. A time out was conducted prior to the start of the procedure. The procedure was tolerated well with a pain level of 0 throughout and a pain level of 0 following the procedure. Post procedure Diagnosis Wound #1: Same as Pre-Procedure Tippetts, Nycole L. ([id number]) Plan Wound Cleansing: Wound #1 Right,Anterior Lower Leg: Clean wound with Normal Saline. Anesthetic: Wound #1 Right,Anterior Lower Leg: Topical Lidocaine 4% cream applied to wound bed prior to debridement Injected 2% Lidocaine without epinephrine prior to debridement - for biopsy Skin Barriers/Peri-Wound Care: Wound #1 Right,Anterior Lower Leg: Skin Prep Primary Wound Dressing: Wound #1 Right,Anterior Lower Leg: Other: - siltec sorbact - item number [id number] Dressing Change Frequency: Wound #1 Right,Anterior Lower Leg: Change Dressing Monday, Wednesday, Friday Follow-up Appointments: Wound #1 Right,Anterior Lower Leg: Return Appointment in 1 week. Home Health: Wound #1 Right,Anterior Lower Leg: [place] Nurse may visit PRN to address patient s wound care needs. FACE TO FACE ENCOUNTER: MEDICARE and MEDICAID PATIENTS: I certify that this patient is under my care and that I had a face-to-face encounter that meets the physician face-to-face encounter requirements with this patient on this date. The encounter with the patient was in whole or in part for the following MEDICAL CONDITION: (primary reason for [place]) MEDICAL NECESSITY: I certify, that based on my findings, NURSING services are a medically necessary home health  service. HOME BOUND STATUS: I certify that my clinical findings support that this patient is homebound (i.e., Due to illness or injury, pt requires aid of supportive devices such as crutches, cane, wheelchairs, walkers, the use of special transportation or the assistance of another person to leave their place of residence. There is a normal inability to leave the home and doing so requires considerable and taxing effort. Other absences are for medical reasons / religious services and are infrequent or of short duration when for other reasons). If current dressing causes regression in wound condition, may D/C ordered dressing product/s and apply Normal Saline Moist Dressing daily until next [place] / Other MD appointment. [place] of regression in wound condition at [phone]. Please direct any NON-WOUND related issues/requests for orders to patient's Primary Care Physician Laboratory ordered were: Biopsy for Pathology - right lower leg Storie, Everetts ([id number]) This elderly patient has a elderly sister who is her caregiver and if the history is right has a open ulcerated wound to the right lower extremity for about 10 years. I have done a wedge biopsy of the edges of the wound and will review this with the pathologist. I have recommended Siltec Sorbact to be applied to the wound and changed about twice a week. She will come back and see as next week. Electronic Signature(s) Signed:  07/07/2015 2:47:17 PM By: Christin Fudge MD, FACS Entered By: Christin Fudge on 07/07/2015 14:47:17 Medinger, Holiday Villarreal Kitchen ([id number]) -------------------------------------------------------------------------------- ROS/PFSH Details Patient Name: LEEYANA, STELMACK 07/07/2015 12:45 Date of Service: PM Medical Record [id number] Number: Patient Account Number: [id number] 1930-12-27 (81 y.o. Treating RN: Montey Hora Date of Birth/Sex: Female) Other Clinician: Primary Care Physician:  Threasa Alpha Treating Tylea Hise Referring Physician: Threasa Alpha Physician/Extender: Weeks in Treatment: 0 Information Obtained From Caregiver Wound History Do you currently have one or more open woundso Yes How many open wounds do you currently haveo 1 Approximately how long have you had your woundso 10 years How have you been treating your wound(s) until nowo silvadene Has your wound(s) ever healed and then re-openedo Yes Have you had any lab work done in the past montho No Have you tested positive for an antibiotic resistant organism (MRSA, VRE)o No Have you tested positive for osteomyelitis (bone infection)o No Have you had any tests for circulation on your legso No Constitutional Symptoms (General Health) Complaints and Symptoms: No Complaints or Symptoms Eyes Complaints and Symptoms: No Complaints or Symptoms Ear/Nose/Mouth/Throat Complaints and Symptoms: No Complaints or Symptoms Hematologic/Lymphatic Complaints and Symptoms: No Complaints or Symptoms Respiratory Complaints and Symptoms: No Complaints or Symptoms Cardiovascular Goncalves, Tykeria L. ([id number]) Medical History: Positive for: Hypertension Past Medical History Notes: tachycardia Gastrointestinal Complaints and Symptoms: No Complaints or Symptoms Endocrine Medical History: Positive for: Type II Diabetes Time with diabetes: 20+ years Treated with: Insulin, Oral agents Blood sugar tested every day: No Genitourinary Complaints and Symptoms: No Complaints or Symptoms Immunological Complaints and Symptoms: No Complaints or Symptoms Integumentary (Skin) Complaints and Symptoms: No Complaints or Symptoms Musculoskeletal Complaints and Symptoms: No Complaints or Symptoms Neurologic Complaints and Symptoms: No Complaints or Symptoms Medical History: Positive for: Dementia Past Medical History Notes: low IQ with developmental delays Oncologic Complaints and Symptoms: No Complaints or  Symptoms Nusser, Maytal L. ([id number]) Medical History: Negative for: Received Chemotherapy; Received Radiation Psychiatric Complaints and Symptoms: No Complaints or Symptoms Family and Social History Never smoker; Marital Status - Single; Alcohol Use: Never; Drug Use: No History; Caffeine Use: Never; Financial Concerns: No; Food, Clothing or Shelter Needs: No; Support System Lacking: No; Transportation Concerns: No; Advanced Directives: Yes - sister is guardian - Tori Milks (Not Provided); Patient does not want information on Advanced Directives Physician Affirmation I have reviewed and agree with the above information. Electronic Signature(s) Signed: 07/07/2015 1:55:29 PM By: Christin Fudge MD, FACS Signed: 07/07/2015 5:17:47 PM By: Montey Hora Entered By: Christin Fudge on 07/07/2015 13:55:29 [place], Ryelle L. ([id number]) -------------------------------------------------------------------------------- SuperBill Details Patient Name: Wyss, Nastassja L. Date of Service: 07/07/2015 Medical Record Number: [id number] Patient Account Number: [id number] Date of Birth/Sex: [birth year] (81 y.o. 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, unspecified Facility Procedures CPT4 Code: [id number] Description: 99213 - WOUND CARE VISIT-LEV 3 EST PT Modifier: Quantity: 1 CPT4 Code: [id number] Description: 11100 - BIOPSY SKIN-ONE ICD-10 Description Diagnosis E11.622 Type 2 diabetes mellitus with other skin ulcer 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 F81.9 Developmental disorder of scholastic skills,  unspe Modifier: layer exposed cified Quantity: 1 Physician Procedures CPT4 Code: [id number] Description: [id number] - WC PHYS LEVEL 4 - NEW PT ICD-10 Description  Diagnosis E11.622 Type 2 diabetes mellitus with other skin ulcer 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  ([id number]) Modifier: layer exposed cified Quantity: 1 Electronic Signature(s) Signed: 07/07/2015 4:25:10 PM By: Montey Hora Signed: 07/07/2015 4:47:37 PM By: Christin Fudge MD, FACS Previous Signature: 07/07/2015 2:47:37 PM Version By: Christin Fudge MD, FACS Entered By: Montey Hora on 07/07/2015 16:25:10

## 2015-07-08 NOTE — Progress Notes (Signed)
XAYAH, DUNSTAN ([id number]) Visit Report for 07/07/2015 Allergy List Details Patient Name: Jennifer Villarreal, Jennifer L. Date of Service: 07/07/2015 12:45 PM Medical Record Number: [id number] Patient Account Number: [id number] Date of Birth/Sex: Jul 04, 1931 (81 y.o. Female) Treating RN: Montey Hora Primary Care Physician: Threasa Alpha Other Clinician: Referring Physician: Threasa Alpha Treating Physician/Extender: Frann Rider in Treatment: 0 Allergies Active Allergies No Known Allergies Allergy Notes Electronic Signature(s) Signed: 07/07/2015 5:17:47 PM By: Montey Hora Entered By: Montey Hora on 07/07/2015 13:30:03 [name], [name] ([id number]) -------------------------------------------------------------------------------- Arrival Information Details Patient Name: Jennifer Villarreal, Jennifer L. Date of Service: 07/07/2015 12:45 PM Medical Record Number: [id number] Patient Account Number: [id number] Date of Birth/Sex: June 11, 1931 (81 y.o. Female) Treating RN: Montey Hora Primary Care Physician: Threasa Alpha Other Clinician: Referring Physician: Threasa Alpha Treating Physician/Extender: Frann Rider in Treatment: 0 Visit Information Patient Arrived: Walker Arrival Time: 13:28 Accompanied By: sister Transfer Assistance: None Patient Identification Verified: Yes Secondary Verification Process Completed: Yes Patient Has Alerts: Yes Patient Alerts: DMII Electronic Signature(s) Signed: 07/07/2015 5:17:47 PM By: Montey Hora Entered By: Montey Hora on 07/07/2015 13:29:35 Jennifer Villarreal, Jennifer L. ([id number]) -------------------------------------------------------------------------------- Clinic Level of Care Assessment Details Patient Name: Jennifer Villarreal, Jennifer L. Date of Service: 07/07/2015 12:45 PM Medical Record Number: [id number] Patient Account Number: [id number] Date of Birth/Sex: 01-Jan-1931 (81 y.o. Female) Treating RN: Montey Hora Primary Care Physician: Threasa Alpha Other Clinician: Referring Physician: Threasa Alpha Treating Physician/Extender: Frann Rider in Treatment: 0 Clinic Level of Care Assessment Items TOOL 1 Quantity Score []  - Use when EandM and Procedure is performed on INITIAL visit 0 ASSESSMENTS - Nursing Assessment / Reassessment X - General Physical Exam (combine w/ comprehensive assessment (listed just 1 20 below) when performed on new pt. evals) X - Comprehensive Assessment (HX, ROS, Risk Assessments, Wounds Hx, etc.) 1 25 ASSESSMENTS - Wound and Skin Assessment / Reassessment []  - Dermatologic / Skin Assessment (not related to wound area) 0 ASSESSMENTS - Ostomy and/or Continence Assessment and Care []  - Incontinence Assessment and Management 0 []  - Ostomy Care Assessment and Management (repouching, etc.) 0 PROCESS - Coordination of Care X - Simple Patient / Family Education for ongoing care 1 15 []  - Complex (extensive) Patient / Family Education for ongoing care 0 X - Staff obtains Programmer, systems, Records, Test Results / Process Orders 1 10 []  - Staff telephones HHA, Nursing Homes / Clarify orders / etc 0 []  - Routine Transfer to another Facility (non-emergent condition) 0 []  - Routine Hospital Admission (non-emergent condition) 0 X - New Admissions / Biomedical engineer / Ordering NPWT, Apligraf, etc. 1 15 []  - Emergency Hospital Admission (emergent condition) 0 PROCESS - Special Needs []  - Pediatric / Minor Patient Management 0 []  - Isolation Patient Management 0 Jennifer Villarreal, Jennifer L. ([id number]) []  - Hearing / Language / Visual special needs 0 []  - Assessment of Community assistance (transportation, D/C planning, etc.) 0 []  - Additional assistance / Altered mentation 0 []  - Support Surface(s) Assessment (bed, cushion, seat, etc.) 0 INTERVENTIONS - Miscellaneous []  - External ear exam 0 []  - Patient Transfer (multiple staff / Civil Service fast streamer / Similar devices) 0 []  - Simple Staple / Suture removal (25 or less)  0 []  - Complex Staple / Suture removal (26 or more) 0 []  - Hypo/Hyperglycemic Management (do not check if billed separately) 0 X - Ankle / Brachial Index (ABI) - do not check if billed separately 1 15 Has the patient been seen at the hospital within the last three years: Yes Total Score: 100  Level Of Care: New/Established - Level 3 Electronic Signature(s) Signed: 07/07/2015 4:24:55 PM By: Montey Hora Entered By: Montey Hora on 07/07/2015 16:24:55 Jennifer Villarreal, [place]. ([id number]) -------------------------------------------------------------------------------- Encounter Discharge Information Details Patient Name: Jennifer Villarreal, Jennifer L. Date of Service: 07/07/2015 12:45 PM Medical Record Number: [id number] Patient Account Number: [id number] Date of Birth/Sex: January 28, 1931 (81 y.o. Female) Treating RN: Montey Hora Primary Care Physician: Threasa Alpha Other Clinician: Referring Physician: Threasa Alpha Treating Physician/Extender: Frann Rider in Treatment: 0 Encounter Discharge Information Items Discharge Pain Level: 0 Discharge Condition: Stable Ambulatory Status: Walker Discharge Destination: Home Transportation: Private Auto Accompanied By: sister Schedule Follow-up Appointment: Yes Medication Reconciliation completed and provided to Patient/Care No [name]: Provided on Clinical Summary of Care: 07/07/2015 Form Type Recipient Paper Patient nm Electronic Signature(s) Signed: 07/07/2015 4:26:15 PM By: Montey Hora Previous Signature: 07/07/2015 2:22:58 PM Version By: Ruthine Dose Entered By: Montey Hora on 07/07/2015 16:26:15 [name], Accident ([id number]) -------------------------------------------------------------------------------- Lower Extremity Assessment Details Patient Name: Jennifer Villarreal, Jennifer L. Date of Service: 07/07/2015 12:45 PM Medical Record Number: [id number] Patient Account Number: [id number] Date of Birth/Sex: Aug 14, 1931 (81 y.o. Female) Treating RN:  Montey Hora Primary Care Physician: Threasa Alpha Other Clinician: Referring Physician: Threasa Alpha Treating Physician/Extender: Frann Rider in Treatment: 0 Edema Assessment Assessed: [Left: No] [Right: No] Edema: [Left: No] [Right: No] Calf Left: Right: Point of Measurement: 31 cm From Medial Instep 28.6 cm 29.5 cm Ankle Left: Right: Point of Measurement: 8 cm From Medial Instep 17.4 cm 16.5 cm Vascular Assessment Pulses: Posterior Tibial Palpable: [Left:Yes] [Right:Yes] Doppler: [Left:Monophasic] [Right:Monophasic] Dorsalis Pedis Palpable: [Left:Yes] [Right:Yes] Doppler: [Left:Monophasic] [Right:Monophasic] Extremity colors, hair growth, and conditions: Extremity Color: [Left:Pale] [Right:Pale] Temperature of Extremity: [Left:Cool] [Right:Cool] Capillary Refill: [Left:< 3 seconds] [Right:< 3 seconds] Blood Pressure: Brachial: [Left:120] [Right:122] Dorsalis Pedis: 100 [Left:Dorsalis Pedis: 82] Ankle: Posterior Tibial: 80 [Left:Posterior Tibial: 0.82] [Right:0.67] Toe Nail Assessment Left: Right: Thick: No No Discolored: No No Deformed: No No Improper Length and Hygiene: No No Yokley, Arsema L. ([id number]) Electronic Signature(s) Signed: 07/07/2015 5:17:47 PM By: Montey Hora Entered By: Montey Hora on 07/07/2015 13:50:40 Maricle, Kemora L. ([id number]) -------------------------------------------------------------------------------- Multi Wound Chart Details Patient Name: Jennifer Villarreal, Jennifer L. Date of Service: 07/07/2015 12:45 PM Medical Record Number: [id number] Patient Account Number: [id number] Date of Birth/Sex: 07/01/1931 (81 y.o. Female) Treating RN: Montey Hora Primary Care Physician: Threasa Alpha Other Clinician: Referring Physician: Threasa Alpha Treating Physician/Extender: Frann Rider in Treatment: 0 Vital Signs Height(in): 63 Pulse(bpm): 74 Weight(lbs): 129 Blood Pressure 119/74 (mmHg): Body Mass Index(BMI):  23 Temperature(F): 98.1 Respiratory Rate 18 (breaths/min): Photos: [1:No Photos] [N/A:N/A] Wound Location: [1:Right Lower Leg - Anterior N/A] Wounding Event: [1:Gradually Appeared] [N/A:N/A] Primary Etiology: [1:Diabetic Wound/Ulcer of N/A the Lower Extremity] Comorbid History: [1:Hypertension, Type II Diabetes, Dementia] [N/A:N/A] Date Acquired: [1:08/15/2004] [N/A:N/A] Weeks of Treatment: [1:0] [N/A:N/A] Wound Status: [1:Open] [N/A:N/A] Measurements L x W x D 2.7x1.7x0.1 [N/A:N/A] (cm) Area (cm) : [1:3.605] [N/A:N/A] Volume (cm) : [1:0.36] [N/A:N/A] Classification: [1:Grade 1] [N/A:N/A] Exudate Amount: [1:Medium] [N/A:N/A] Exudate Type: [1:Serous] [N/A:N/A] Exudate Color: [1:amber] [N/A:N/A] Wound Margin: [1:Flat and Intact] [N/A:N/A] Granulation Amount: [1:Large (67-100%)] [N/A:N/A] Granulation Quality: [1:Red, Hyper-granulation] [N/A:N/A] Necrotic Amount: [1:None Present (0%)] [N/A:N/A] Exposed Structures: [1:Fascia: No Fat: No Tendon: No Muscle: No Joint: No Bone: No Limited to Skin Breakdown] [N/A:N/A] Epithelialization: None N/A N/A Periwound Skin Texture: Edema: No N/A N/A Excoriation: No Induration: No Callus: No Crepitus: No Fluctuance: No Friable: No Rash: No Scarring: No Periwound Skin Maceration: No N/A N/A Moisture: Moist: No Dry/Scaly: No Periwound  Skin Color: Atrophie Blanche: No N/A N/A Cyanosis: No Ecchymosis: No Erythema: No Hemosiderin Staining: No Mottled: No Pallor: No Rubor: No Tenderness on Yes N/A N/A Palpation: Wound Preparation: Ulcer Cleansing: N/A N/A Rinsed/Irrigated with Saline Topical Anesthetic Applied: Other: lidocaine 4% Treatment Notes Electronic Signature(s) Signed: 07/07/2015 5:17:47 PM By: Montey Hora Entered By: Montey Hora on 07/07/2015 14:00:01 Jennifer Villarreal, Jennifer Hill L. ([id number]) -------------------------------------------------------------------------------- [place] Details Patient Name:  Bruhl, Lilliah L. Date of Service: 07/07/2015 12:45 PM Medical Record Number: [id number] Patient Account Number: [id number] Date of Birth/Sex: [birth year] (81 y.o. Female) Treating RN: Montey Hora Primary Care Physician: Threasa Alpha Other Clinician: Referring Physician: Threasa Alpha Treating Physician/Extender: Frann Rider in Treatment: 0 Active Inactive Abuse / Safety / Falls / Self Care Management Nursing Diagnoses: Impaired physical mobility Potential for falls Goals: Patient will remain injury free Date Initiated: 07/07/2015 Goal Status: Active Interventions: Assess fall risk on admission and as needed Notes: Orientation to the Wound Care Program Nursing Diagnoses: Knowledge deficit related to the wound healing center program Goals: Patient/caregiver will verbalize understanding of the [facility] Date Initiated: 07/07/2015 Goal Status: Active Interventions: Provide education on orientation to the wound center Notes: Wound/Skin Impairment Nursing Diagnoses: Impaired tissue integrity Goals: Patient/caregiver will verbalize understanding of skin care regimen Jennifer Villarreal, Jennifer Villarreal ([id number]) Date Initiated: 07/07/2015 Goal Status: Active Ulcer/skin breakdown will have a volume reduction of 30% by week 4 Date Initiated: 07/07/2015 Goal Status: Active Ulcer/skin breakdown will have a volume reduction of 50% by week 8 Date Initiated: 07/07/2015 Goal Status: Active Ulcer/skin breakdown will have a volume reduction of 80% by week 12 Date Initiated: 07/07/2015 Goal Status: Active Ulcer/skin breakdown will heal within 14 weeks Date Initiated: 07/07/2015 Goal Status: Active Interventions: Assess ulceration(s) every visit Notes: Electronic Signature(s) Signed: 07/07/2015 5:17:47 PM By: Montey Hora Entered By: Montey Hora on 07/07/2015 13:59:43 Jennifer Villarreal, [place].  ([id number]) -------------------------------------------------------------------------------- Patient/Caregiver Education Details Patient Name: Jennifer Villarreal, Jennifer L. Date of Service: 07/07/2015 12:45 PM Medical Record Number: [id number] Patient Account Number: [id number] Date of Birth/Gender: [birth year] (81 y.o. Female) Treating RN: Montey Hora Primary Care Physician: Threasa Alpha Other Clinician: Referring Physician: Threasa Alpha Treating Physician/Extender: Frann Rider in Treatment: 0 Education Assessment Education Provided To: Caregiver Education Topics Provided Wound/Skin Impairment: Handouts: Other: wound care as ordered Methods: Demonstration, Explain/Verbal Responses: State content correctly Electronic Signature(s) Signed: 07/07/2015 4:26:34 PM By: Montey Hora Entered By: Montey Hora on 07/07/2015 16:26:34 Jennifer Villarreal [place], [place] ([id number]) -------------------------------------------------------------------------------- Wound Assessment Details Patient Name: Gaylord, Kenzy L. Date of Service: 07/07/2015 12:45 PM Medical Record Number: [id number] Patient Account Number: [id number] Date of Birth/Sex: 07-05-1931 (81 y.o. Female) Treating RN: Montey Hora Primary Care Physician: Threasa Alpha Other Clinician: Referring Physician: Threasa Alpha Treating Physician/Extender: Frann Rider in Treatment: 0 Wound Status Wound Number: 1 Primary Diabetic Wound/Ulcer of the Lower Etiology: Extremity Wound Location: Right Lower Leg - Anterior Wound Status: Open Wounding Event: Gradually Appeared Comorbid Hypertension, Type II Diabetes, Date Acquired: 08/15/2004 History: Dementia Weeks Of Treatment: 0 Clustered Wound: No Photos Photo Uploaded By: Montey Hora on 07/07/2015 16:51:38 Wound Measurements Length: (cm) 2.7 Width: (cm) 1.7 Depth: (cm) 0.1 Area: (cm) 3.605 Volume: (cm) 0.36 % Reduction in Area: % Reduction in Volume: Epithelialization:  None Tunneling: No Undermining: No Wound Description Classification: Grade 1 Wound Margin: Flat and Intact Exudate Amount: Medium Exudate Type: Serous Exudate Color: amber Foul Odor After Cleansing: No Wound Bed Granulation Amount: Large (67-100%) Exposed Structure Granulation Quality: Red, Hyper-granulation Fascia Exposed: No Necrotic  Amount: None Present (0%) Fat Layer Exposed: No Tendon Exposed: No Gunner, Brookelyn L. ([id number]) Muscle Exposed: No Joint Exposed: No Bone Exposed: No Limited to Skin Breakdown Periwound Skin Texture Texture Color No Abnormalities Noted: No No Abnormalities Noted: No Callus: No Atrophie Blanche: No Crepitus: No Cyanosis: No Excoriation: No Ecchymosis: No Fluctuance: No Erythema: No Friable: No Hemosiderin Staining: No Induration: No Mottled: No Localized Edema: No Pallor: No Rash: No Rubor: No Scarring: No Temperature / Pain Moisture Tenderness on Palpation: Yes No Abnormalities Noted: No Dry / Scaly: No Maceration: No Moist: No Wound Preparation Ulcer Cleansing: Rinsed/Irrigated with Saline Topical Anesthetic Applied: Other: lidocaine 4%, Treatment Notes Wound #1 (Right, Anterior Lower Leg) 1. Cleansed with: Clean wound with Normal Saline 2. Anesthetic Topical Lidocaine 4% cream to wound bed prior to debridement 2% Lidocaine injectible with epinephrine prior to debridement 3. Peri-wound Care: Skin Prep 4. Dressing Applied: Other dressing (specify in notes) Notes siltec sorbact Electronic Signature(s) Signed: 07/07/2015 5:17:47 PM By: Montey Hora Entered By: Montey Hora on 07/07/2015 13:47:33 Bazzle, Shailyn L. ([id number]) -------------------------------------------------------------------------------- Vitals Details Patient Name: Toft, Emilene L. Date of Service: 07/07/2015 12:45 PM Medical Record Number: [id number] Patient Account Number: [id number] Date of Birth/Sex: March 24, 1931 (81 y.o. Female) Treating RN:  Montey Hora Primary Care Physician: Threasa Alpha Other Clinician: Referring Physician: Threasa Alpha Treating Physician/Extender: Frann Rider in Treatment: 0 Vital Signs Time Taken: 13:32 Temperature (F): 98.1 Height (in): 63 Pulse (bpm): 74 Source: Stated Respiratory Rate (breaths/min): 18 Weight (lbs): 129 Blood Pressure (mmHg): 119/74 Source: Stated Reference Range: 80 - 120 mg / dl Body Mass Index (BMI): 22.8 Electronic Signature(s) Signed: 07/07/2015 5:17:47 PM By: Montey Hora Entered By: Montey Hora on 07/07/2015 13:32:23

## 2015-07-08 NOTE — Progress Notes (Signed)
Jennifer Villarreal, Jennifer Villarreal ([id number]) Visit Report for 07/07/2015 Abuse/Suicide Risk Screen Details Patient Name: Jennifer Villarreal, Jennifer Villarreal 07/07/2015 12:45 Date of Service: PM Medical Record [id number] Number: Patient Account Number: [id number] 1931/03/01 (81 y.o. Treating RN: Montey Hora Date of Birth/Sex: Female) Other Clinician: Primary Care Physician: Threasa Alpha Treating Britto, Errol Referring Physician: Threasa Alpha Physician/Extender: Weeks in Treatment: 0 Abuse/Suicide Risk Screen Items Answer ABUSE/SUICIDE RISK SCREEN: Has anyone close to you tried to hurt or harm you recentlyo No Do you feel uncomfortable with anyone in your familyo No Has anyone forced you do things that you didnot want to doo No Do you have any thoughts of harming yourselfo No Patient displays signs or symptoms of abuse and/or neglect. No Electronic Signature(s) Signed: 07/07/2015 5:17:47 PM By: Montey Hora Entered By: Montey Hora on 07/07/2015 13:35:59 Klem, Cherisa L. ([id number]) -------------------------------------------------------------------------------- Activities of Daily Living Details Patient Name: Jennifer Villarreal, Jennifer Villarreal 07/07/2015 12:45 Date of Service: PM Medical Record [id number] Number: Patient Account Number: [id number] 04-28-1931 (81 y.o. Treating RN: Montey Hora Date of Birth/Sex: Female) Other Clinician: Primary Care Physician: Threasa Alpha Treating Christin Fudge Referring Physician: Threasa Alpha Physician/Extender: Weeks in Treatment: 0 Activities of Daily Living Items Answer Activities of Daily Living (Please select one for each item) Drive Automobile Not Able Take Medications Need Assistance Use Telephone Need Assistance Care for Appearance Need Assistance Use Toilet Completely Psychologist, forensic / Shower Need Assistance Dress Self Completely Able Feed Self Completely Able Walk Completely Able Get In / Out Bed Completely Able Housework Completely [place] for Self Need Assistance Electronic Signature(s) Signed: 07/07/2015 5:17:47 PM By: Montey Hora Entered By: Montey Hora on 07/07/2015 13:37:50 Labelle, Annaliyah L. ([id number]) -------------------------------------------------------------------------------- Education Assessment Details Patient Name: Jennifer Villarreal, Jennifer Villarreal 07/07/2015 12:45 Date of Service: PM Medical Record [id number] Number: Patient Account Number: [id number] [birth year] (81 y.o. Treating RN: Montey Hora Date of Birth/Sex: Female) Other Clinician: Primary Care Physician: Threasa Alpha Treating Christin Fudge Referring Physician: Threasa Alpha Physician/Extender: [place] in Treatment: 0 Primary Learner Assessed: Caregiver sister Margie Reason Patient is not Primary Learner: developmental delays Learning Preferences/Education Level/Primary Language Learning Preference: Explanation, Demonstration Highest Education Level: High School Preferred Language: English Cognitive Barrier Assessment/Beliefs Language Barrier: No Translator Needed: No Memory Deficit: No Emotional Barrier: No Cultural/Religious Beliefs Affecting Medical No Care: Physical Barrier Assessment Impaired Vision: No Impaired Hearing: No Decreased Hand dexterity: No Knowledge/Comprehension Assessment Knowledge Level: Medium Comprehension Level: Medium Ability to understand written Medium instructions: Ability to understand verbal Medium instructions: Motivation Assessment Anxiety Level: Anxious Cooperation: Cooperative Education Importance: Acknowledges Need Interest in Health Problems: Uninterested Perception: Confused Willingness to Engage in Self- Low Management Activities: Low Haffey, Achille. ([id number]) Readiness to Engage in Self- Management Activities: Electronic Signature(s) Signed: 07/07/2015 5:17:47 PM By: Montey Hora Entered By: Montey Hora on 07/07/2015 13:38:48 Bal, Orie L.  ([id number]) -------------------------------------------------------------------------------- Fall Risk Assessment Details Patient Name: Jennifer Villarreal, Jennifer Villarreal 07/07/2015 12:45 Date of Service: PM Medical Record [id number] Number: Patient Account Number: [id number] 20-Jul-1931 (81 y.o. Treating RN: Montey Hora Date of Birth/Sex: Female) Other Clinician: Primary Care Physician: Threasa Alpha Treating Britto, Errol Referring Physician: Threasa Alpha Physician/Extender: Weeks in Treatment: 0 Fall Risk Assessment Items Have you had 2 or more falls in the last 12 monthso 0 Yes Have you had any fall that resulted in injury in the last 12 monthso 0 No FALL RISK ASSESSMENT: History of falling - immediate or within 3 months 25 Yes Secondary diagnosis 0 No Ambulatory aid  None/bed rest/wheelchair/nurse 0 No Crutches/cane/walker 15 Yes Furniture 0 No IV Access/Saline Lock 0 No Gait/Training Normal/bed rest/immobile 0 No Weak 10 Yes Impaired 0 No Mental Status Oriented to own ability 0 Yes Electronic Signature(s) Signed: 07/07/2015 5:17:47 PM By: Montey Hora Entered By: Montey Hora on 07/07/2015 13:40:18 [place], [place]. ([id number]) -------------------------------------------------------------------------------- Foot Assessment Details Patient Name: Jennifer Villarreal, Jennifer Villarreal 07/07/2015 12:45 Date of Service: PM Medical Record [id number] Number: Patient Account Number: [id number] January 13, 1931 (81 y.o. Treating RN: Montey Hora Date of Birth/Sex: Female) Other Clinician: Primary Care Physician: Threasa Alpha Treating Britto, Errol Referring Physician: Threasa Alpha Physician/Extender: Weeks in Treatment: 0 Foot Assessment Items Site Locations + = Sensation present, - = Sensation absent, C = Callus, U = Ulcer R = Redness, W = Warmth, M = Maceration, PU = Pre-ulcerative lesion F = Fissure, S = Swelling, D = Dryness Assessment Right: Left: Other Deformity: No No Prior Foot Ulcer:  No No Prior Amputation: No No Charcot Joint: No No Ambulatory Status: Ambulatory With Help Assistance Device: Walker Gait: Steady Electronic Signature(s) Signed: 07/07/2015 5:17:47 PM By: Montey Hora Entered By: Montey Hora on 07/07/2015 13:43:47 Wilber, [place]. ([id number]) [place], [place] ([id number]) -------------------------------------------------------------------------------- Nutrition Risk Assessment Details Patient Name: Jennifer Villarreal, Jennifer Villarreal 07/07/2015 12:45 Date of Service: PM Medical Record [id number] Number: Patient Account Number: [id number] 10-31-1930 (81 y.o. Treating RN: Montey Hora Date of Birth/Sex: Female) Other Clinician: Primary Care Physician: Threasa Alpha Treating Britto, Errol Referring Physician: Threasa Alpha Physician/Extender: Weeks in Treatment: 0 Height (in): 63 Weight (lbs): 129 Body Mass Index (BMI): 22.8 Nutrition Risk Assessment Items NUTRITION RISK SCREEN: I have an illness or condition that made me change the kind and/or 0 No amount of food I eat I eat fewer than two meals per day 0 No I eat few fruits and vegetables, or milk products 0 No I have three or more drinks of beer, liquor or wine almost every day 0 No I have tooth or mouth problems that make it hard for me to eat 0 No I don't always have enough money to buy the food I need 0 No I eat alone most of the time 0 No I take three or more different prescribed or over-the-counter drugs a 1 Yes day Without wanting to, I have lost or gained 10 pounds in the last six 0 No months I am not always physically able to shop, cook and/or feed myself 0 No Nutrition Protocols Good Risk Protocol 0 No interventions needed Moderate Risk Protocol Electronic Signature(s) Signed: 07/07/2015 5:17:47 PM By: Montey Hora Entered By: Montey Hora on 07/07/2015 13:40:28

## 2015-07-16 ENCOUNTER — Encounter: Payer: Medicare Other | Attending: Surgery | Admitting: Surgery

## 2015-07-16 DIAGNOSIS — C44722 Squamous cell carcinoma of skin of right lower limb, including hip: Secondary | ICD-10-CM | POA: Diagnosis not present

## 2015-07-16 DIAGNOSIS — I10 Essential (primary) hypertension: Secondary | ICD-10-CM | POA: Insufficient documentation

## 2015-07-16 DIAGNOSIS — E11622 Type 2 diabetes mellitus with other skin ulcer: Secondary | ICD-10-CM | POA: Insufficient documentation

## 2015-07-16 DIAGNOSIS — K219 Gastro-esophageal reflux disease without esophagitis: Secondary | ICD-10-CM | POA: Insufficient documentation

## 2015-07-16 DIAGNOSIS — F819 Developmental disorder of scholastic skills, unspecified: Secondary | ICD-10-CM | POA: Diagnosis not present

## 2015-07-16 DIAGNOSIS — Z86711 Personal history of pulmonary embolism: Secondary | ICD-10-CM | POA: Diagnosis not present

## 2015-07-16 DIAGNOSIS — L97212 Non-pressure chronic ulcer of right calf with fat layer exposed: Secondary | ICD-10-CM | POA: Diagnosis not present

## 2015-07-17 ENCOUNTER — Ambulatory Visit: Payer: Self-pay | Admitting: Surgery

## 2015-07-17 NOTE — Progress Notes (Signed)
Jennifer Villarreal, Jennifer Villarreal ([id number]) Visit Report for 07/16/2015 Arrival Information Details Patient Name: Jennifer Villarreal, Jennifer Villarreal. Date of Service: 07/16/2015 1:30 PM Medical Record Number: [id number] Patient Account Number: [id number] Date of Birth/Sex: 07-08-1931 (81 y.o. Female) Treating RN: Afful, RN, BSN, Velva Harman Primary Care Physician: Threasa Alpha Other Clinician: Referring Physician: Threasa Alpha Treating Physician/Extender: Frann Rider in Treatment: 1 Visit Information History Since Last Visit Added or deleted any medications: No Patient Arrived: [place] Any new allergies or adverse reactions: No Arrival Time: 13:36 Had a fall or experienced change in No Accompanied By: sister activities of daily living that may affect Transfer Assistance: None risk of falls: Patient Identification Verified: Yes Signs or symptoms of abuse/neglect since last No Secondary Verification Process Completed: Yes visito Patient Has Alerts: Yes Hospitalized since last visit: No Patient Alerts: DMII Has Dressing in Place as Prescribed: Yes Pain Present Now: Yes Electronic Signature(s) Signed: 07/16/2015 2:04:04 PM By: Regan Lemming BSN, RN Previous Signature: 07/16/2015 1:41:49 PM Version By: Regan Lemming BSN, RN Entered By: Regan Lemming on 07/16/2015 14:04:04 Pichardo, Aurea Villarreal. ([id number]) -------------------------------------------------------------------------------- Clinic Level of Care Assessment Details Patient Name: Jennifer Villarreal, Jennifer Villarreal. Date of Service: 07/16/2015 1:30 PM Medical Record Number: [id number] Patient Account Number: [id number] Date of Birth/Sex: 1930-09-15 (81 y.o. Female) Treating RN: Afful, RN, BSN, Velva Harman Primary Care Physician: Threasa Alpha Other Clinician: Referring Physician: Threasa Alpha Treating Physician/Extender: Frann Rider in Treatment: 1 Clinic Level of Care Assessment Items TOOL 4 Quantity Score []  - Use when only an EandM is performed on FOLLOW-UP visit  0 ASSESSMENTS - Nursing Assessment / Reassessment X - Reassessment of Co-morbidities (includes updates in patient status) 1 10 X - Reassessment of Adherence to Treatment Plan 1 5 ASSESSMENTS - Wound and Skin Assessment / Reassessment X - Simple Wound Assessment / Reassessment - one wound 1 5 []  - Complex Wound Assessment / Reassessment - multiple wounds 0 []  - Dermatologic / Skin Assessment (not related to wound area) 0 ASSESSMENTS - Focused Assessment []  - Circumferential Edema Measurements - multi extremities 0 []  - Nutritional Assessment / Counseling / Intervention 0 X - Lower Extremity Assessment (monofilament, tuning fork, pulses) 1 5 []  - Peripheral Arterial Disease Assessment (using hand held doppler) 0 ASSESSMENTS - Ostomy and/or Continence Assessment and Care []  - Incontinence Assessment and Management 0 []  - Ostomy Care Assessment and Management (repouching, etc.) 0 PROCESS - Coordination of Care X - Simple Patient / Family Education for ongoing care 1 15 []  - Complex (extensive) Patient / Family Education for ongoing care 0 X - Staff obtains Programmer, systems, Records, Test Results / Process Orders 1 10 []  - Staff telephones HHA, Nursing Homes / Clarify orders / etc 0 []  - Routine Transfer to another Facility (non-emergent condition) 0 Jennifer Villarreal, Jennifer Villarreal. ([id number]) []  - Routine Hospital Admission (non-emergent condition) 0 []  - New Admissions / Biomedical engineer / Ordering NPWT, Apligraf, etc. 0 []  - Emergency Hospital Admission (emergent condition) 0 []  - Simple Discharge Coordination 0 []  - Complex (extensive) Discharge Coordination 0 PROCESS - Special Needs []  - Pediatric / Minor Patient Management 0 []  - Isolation Patient Management 0 []  - Hearing / Language / Visual special needs 0 []  - Assessment of Community assistance (transportation, D/C planning, etc.) 0 []  - Additional assistance / Altered mentation 0 []  - Support Surface(s) Assessment (bed, cushion, seat, etc.)  0 INTERVENTIONS - Wound Cleansing / Measurement X - Simple Wound Cleansing - one wound 1 5 []  - Complex Wound Cleansing - multiple wounds  0 X - Wound Imaging (photographs - any number of wounds) 1 5 []  - Wound Tracing (instead of photographs) 0 X - Simple Wound Measurement - one wound 1 5 []  - Complex Wound Measurement - multiple wounds 0 INTERVENTIONS - Wound Dressings X - Small Wound Dressing one or multiple wounds 1 10 []  - Medium Wound Dressing one or multiple wounds 0 []  - Large Wound Dressing one or multiple wounds 0 []  - Application of Medications - topical 0 []  - Application of Medications - injection 0 INTERVENTIONS - Miscellaneous []  - External ear exam 0 Jennifer Villarreal, Jennifer Villarreal. ([id number]) []  - Specimen Collection (cultures, biopsies, blood, body fluids, etc.) 0 []  - Specimen(s) / Culture(s) sent or taken to Lab for analysis 0 []  - Patient Transfer (multiple staff / Harrel Lemon Lift / Similar devices) 0 []  - Simple Staple / Suture removal (25 or less) 0 []  - Complex Staple / Suture removal (26 or more) 0 []  - Hypo / Hyperglycemic Management (close monitor of Blood Glucose) 0 []  - Ankle / Brachial Index (ABI) - do not check if billed separately 0 X - Vital Signs 1 5 Has the patient been seen at the hospital within the last three years: Yes Total Score: 80 Level Of Care: New/Established - Level 3 Electronic Signature(s) Signed: 07/16/2015 2:07:01 PM By: Regan Lemming BSN, RN Previous Signature: 07/16/2015 2:05:05 PM Version By: Regan Lemming BSN, RN Entered By: Regan Lemming on 07/16/2015 14:07:00 [place], [place] ([id number]) -------------------------------------------------------------------------------- Encounter Discharge Information Details Patient Name: Jennifer Villarreal, Jennifer Villarreal. Date of Service: 07/16/2015 1:30 PM Medical Record Number: [id number] Patient Account Number: [id number] Date of Birth/Sex: [birth year] (81 y.o. Female) Treating RN: Afful, RN, BSN, Velva Harman Primary Care Physician: Threasa Alpha Other Clinician: Referring Physician: Threasa Alpha Treating Physician/Extender: Frann Rider in Treatment: 1 Encounter Discharge Information Items Discharge Pain Level: 0 Discharge Condition: Stable Ambulatory Status: Ambulatory Discharge Destination: Home Transportation: Private Auto Accompanied By: sister Schedule Follow-up Appointment: No Medication Reconciliation completed No and provided to Patient/Care Jacara Benito: Patient Clinical Summary of Care: Declined Electronic Signature(s) Signed: 07/16/2015 2:07:55 PM By: Regan Lemming BSN, RN Previous Signature: 07/16/2015 1:59:04 PM Version By: Ruthine Dose Entered By: Regan Lemming on 07/16/2015 14:07:55 Jennifer Villarreal, Jennifer Villarreal ([id number]) -------------------------------------------------------------------------------- Lower Extremity Assessment Details Patient Name: Sing, Malyn Villarreal. Date of Service: 07/16/2015 1:30 PM Medical Record Number: [id number] Patient Account Number: [id number] Date of Birth/Sex: May 13, 1931 (81 y.o. Female) Treating RN: Afful, RN, BSN, Velva Harman Primary Care Physician: Threasa Alpha Other Clinician: Referring Physician: Threasa Alpha Treating Physician/Extender: Frann Rider in Treatment: 1 Edema Assessment Assessed: [Left: No] [Right: No] Edema: [Left: N] [Right: o] Calf Left: Right: Point of Measurement: 31 cm From Medial Instep cm 29.5 cm Ankle Left: Right: Point of Measurement: 8 cm From Medial Instep cm 16.5 cm Vascular Assessment Claudication: Claudication Assessment [Right:None] Pulses: Posterior Tibial Dorsalis Pedis Palpable: [Right:Yes] Extremity colors, hair growth, and conditions: Extremity Color: [Right:Normal] Hair Growth on Extremity: [Right:Yes] Temperature of Extremity: [Right:Warm] Capillary Refill: [Right:< 3 seconds] Electronic Signature(s) Signed: 07/16/2015 1:45:06 PM By: Regan Lemming BSN, RN Entered By: Regan Lemming on 07/16/2015 13:45:06 [place], Sugey Villarreal.  ([id number]) -------------------------------------------------------------------------------- Multi Wound Chart Details Patient Name: Chavers, Janasha Villarreal. Date of Service: 07/16/2015 1:30 PM Medical Record Number: [id number] Patient Account Number: [id number] Date of Birth/Sex: 23-Sep-1930 (81 y.o. Female) Treating RN: Baruch Gouty, RN, BSN, Velva Harman Primary Care Physician: Threasa Alpha Other Clinician: Referring Physician: Threasa Alpha Treating Physician/Extender: Frann Rider in Treatment: 1 Vital Signs Height(in): 63 Pulse(bpm): 75  Weight(lbs): 129 Blood Pressure 115/72 (mmHg): Body Mass Index(BMI): 23 Temperature(F): 97.8 Respiratory Rate 16 (breaths/min): Photos: [1:No Photos] [N/A:N/A] Wound Location: [1:Right Lower Leg - Anterior N/A] Wounding Event: [1:Gradually Appeared] [N/A:N/A] Primary Etiology: [1:Diabetic Wound/Ulcer of N/A the Lower Extremity] Comorbid History: [1:Hypertension, Type II Diabetes, Dementia] [N/A:N/A] Date Acquired: [1:08/15/2004] [N/A:N/A] Weeks of Treatment: [1:1] [N/A:N/A] Wound Status: [1:Open] [N/A:N/A] Measurements Villarreal x W x D 2.2x1.5x0.1 [N/A:N/A] (cm) Area (cm) : [1:2.592] [N/A:N/A] Volume (cm) : [1:0.259] [N/A:N/A] % Reduction in Area: [1:28.10%] [N/A:N/A] % Reduction in Volume: 28.10% [N/A:N/A] Classification: [1:Grade 1] [N/A:N/A] Exudate Amount: [1:Medium] [N/A:N/A] Exudate Type: [1:Serous] [N/A:N/A] Exudate Color: [1:amber] [N/A:N/A] Wound Margin: [1:Flat and Intact] [N/A:N/A] Granulation Amount: [1:Large (67-100%)] [N/A:N/A] Granulation Quality: [1:Red, Hyper-granulation] [N/A:N/A] Necrotic Amount: [1:None Present (0%)] [N/A:N/A] Exposed Structures: [1:Fascia: No Fat: No Tendon: No Muscle: No Joint: No Bone: No] [N/A:N/A] Limited to Skin Breakdown Epithelialization: None N/A N/A Periwound Skin Texture: Edema: No N/A N/A Excoriation: No Induration: No Callus: No Crepitus: No Fluctuance: No Friable: No Rash: No Scarring:  No Periwound Skin Moist: Yes N/A N/A Moisture: Maceration: No Dry/Scaly: No Periwound Skin Color: Atrophie Blanche: No N/A N/A Cyanosis: No Ecchymosis: No Erythema: No Hemosiderin Staining: No Mottled: No Pallor: No Rubor: No Tenderness on Yes N/A N/A Palpation: Wound Preparation: Ulcer Cleansing: N/A N/A Rinsed/Irrigated with Saline Topical Anesthetic Applied: Other: lidocaine 4% Treatment Notes Electronic Signature(s) Signed: 07/16/2015 1:50:29 PM By: Regan Lemming BSN, RN Entered By: Regan Lemming on 07/16/2015 13:50:29 Jennifer Villarreal, Jennifer Villarreal. ([id number]) -------------------------------------------------------------------------------- [place] Details Patient Name: Jennifer Villarreal, Jennifer Villarreal. Date of Service: 07/16/2015 1:30 PM Medical Record Number: [id number] Patient Account Number: [id number] Date of Birth/Sex: 1931/03/04 (81 y.o. Female) Treating RN: Afful, RN, BSN, Velva Harman Primary Care Physician: Threasa Alpha Other Clinician: Referring Physician: Threasa Alpha Treating Physician/Extender: Frann Rider in Treatment: 1 Active Inactive Abuse / Safety / Falls / Self Care Management Nursing Diagnoses: Impaired physical mobility Potential for falls Goals: Patient will remain injury free Date Initiated: 07/07/2015 Goal Status: Active Interventions: Assess fall risk on admission and as needed Notes: Orientation to the Wound Care Program Nursing Diagnoses: Knowledge deficit related to the wound healing center program Goals: Patient/caregiver will verbalize understanding of the [facility] Date Initiated: 07/07/2015 Goal Status: Active Interventions: Provide education on orientation to the wound center Notes: Wound/Skin Impairment Nursing Diagnoses: Impaired tissue integrity Goals: Patient/caregiver will verbalize understanding of skin care regimen Jennifer Villarreal, Jennifer Villarreal ([id number]) Date Initiated: 07/07/2015 Goal Status: Active Ulcer/skin  breakdown will have a volume reduction of 30% by week 4 Date Initiated: 07/07/2015 Goal Status: Active Ulcer/skin breakdown will have a volume reduction of 50% by week 8 Date Initiated: 07/07/2015 Goal Status: Active Ulcer/skin breakdown will have a volume reduction of 80% by week 12 Date Initiated: 07/07/2015 Goal Status: Active Ulcer/skin breakdown will heal within 14 weeks Date Initiated: 07/07/2015 Goal Status: Active Interventions: Assess ulceration(s) every visit Notes: Electronic Signature(s) Signed: 07/16/2015 1:50:22 PM By: Regan Lemming BSN, RN Entered By: Regan Lemming on 07/16/2015 13:50:22 Jennifer Villarreal, Jennifer [facility]. ([id number]) -------------------------------------------------------------------------------- Pain Assessment Details Patient Name: Jennifer Villarreal, Jennifer Villarreal. Date of Service: 07/16/2015 1:30 PM Medical Record Number: [id number] Patient Account Number: [id number] Date of Birth/Sex: [birth year] (81 y.o. Female) Treating RN: Afful, RN, BSN, Velva Harman Primary Care Physician: Threasa Alpha Other Clinician: Referring Physician: Threasa Alpha Treating Physician/Extender: Frann Rider in Treatment: 1 Active Problems Location of Pain Severity and Description of Pain Patient Has Paino Yes Site Locations Pain Location: Pain in Ulcers Character of Pain  Describe the Pain: Aching, Tender Pain Management and Medication Current Pain Management: Rest: Yes How does your pain impact your activities of daily livingo Sleep: Yes Bathing: Yes Appetite: Yes Relationship With Others: Yes Bladder Continence: Yes Emotions: Yes Bowel Continence: Yes Work: Yes Toileting: Yes Drive: Yes Dressing: Yes Hobbies: Yes Electronic Signature(s) Signed: 07/16/2015 1:43:47 PM By: Regan Lemming BSN, RN Entered By: Regan Lemming on 07/16/2015 13:43:47 Jennifer Villarreal, Jennifer Villarreal. ([id number]) -------------------------------------------------------------------------------- Patient/Caregiver Education Details Patient  Name: Boruff, Kemi Villarreal. Date of Service: 07/16/2015 1:30 PM Medical Record Number: [id number] Patient Account Number: [id number] Date of Birth/Gender: Feb 19, 1931 (81 y.o. Female) Treating RN: Baruch Gouty, RN, BSN, Velva Harman Primary Care Physician: Threasa Alpha Other Clinician: Referring Physician: Threasa Alpha Treating Physician/Extender: Frann Rider in Treatment: 1 Education Assessment Education Provided To: Patient Education Topics Provided Basic Hygiene: Methods: Explain/Verbal Responses: State content correctly Welcome To [facility]: Methods: Explain/Verbal Responses: State content correctly Electronic Signature(s) Signed: 07/16/2015 2:08:58 PM By: Regan Lemming BSN, RN Entered By: Regan Lemming on 07/16/2015 14:08:57 [place], [place]. ([id number]) -------------------------------------------------------------------------------- Wound Assessment Details Patient Name: [place], Larkyn Villarreal. Date of Service: 07/16/2015 1:30 PM Medical Record Number: [id number] Patient Account Number: [id number] Date of Birth/Sex: January 27, 1931 (81 y.o. Female) Treating RN: Afful, RN, BSN, Velva Harman Primary Care Physician: Threasa Alpha Other Clinician: Referring Physician: Threasa Alpha Treating Physician/Extender: Frann Rider in Treatment: 1 Wound Status Wound Number: 1 Primary Diabetic Wound/Ulcer of the Lower Etiology: Extremity Wound Location: Right Lower Leg - Anterior Wound Status: Open Wounding Event: Gradually Appeared Comorbid Hypertension, Type II Diabetes, Date Acquired: 08/15/2004 History: Dementia Weeks Of Treatment: 1 Clustered Wound: No Photos Photo Uploaded By: Regan Lemming on 07/16/2015 16:42:46 Wound Measurements Length: (cm) 2.2 Width: (cm) 1.5 Depth: (cm) 0.1 Area: (cm) 2.592 Volume: (cm) 0.259 % Reduction in Area: 28.1% % Reduction in Volume: 28.1% Epithelialization: None Tunneling: No Undermining: No Wound Description Classification: Grade 1 Wound  Margin: Flat and Intact Exudate Amount: Medium Exudate Type: Serous Exudate Color: amber Foul Odor After Cleansing: No Wound Bed Granulation Amount: Large (67-100%) Exposed Structure Granulation Quality: Red, Hyper-granulation Fascia Exposed: No Necrotic Amount: None Present (0%) Fat Layer Exposed: No Tendon Exposed: No [place], Karine Villarreal. ([id number]) Muscle Exposed: No Joint Exposed: No Bone Exposed: No Limited to Skin Breakdown Periwound Skin Texture Texture Color No Abnormalities Noted: No No Abnormalities Noted: No Callus: No Atrophie Blanche: No Crepitus: No Cyanosis: No Excoriation: No Ecchymosis: No Fluctuance: No Erythema: No Friable: No Hemosiderin Staining: No Induration: No Mottled: No Localized Edema: No Pallor: No Rash: No Rubor: No Scarring: No Temperature / Pain Moisture Tenderness on Palpation: Yes No Abnormalities Noted: No Dry / Scaly: No Maceration: No Moist: Yes Wound Preparation Ulcer Cleansing: Rinsed/Irrigated with Saline Topical Anesthetic Applied: Other: lidocaine 4%, Treatment Notes Wound #1 (Right, Anterior Lower Leg) 1. Cleansed with: Clean wound with Normal Saline 4. Dressing Applied: Other dressing (specify in notes) Notes siltec sorbact Electronic Signature(s) Signed: 07/16/2015 1:48:27 PM By: Regan Lemming BSN, RN Entered By: Regan Lemming on 07/16/2015 13:48:27 Satt[place], Lashae Villarreal. ([id number]) -------------------------------------------------------------------------------- Vitals Details Patient Name: Wellen, Ines Villarreal. Date of Service: 07/16/2015 1:30 PM Medical Record Number: [id number] Patient Account Number: [id number] Date of Birth/Sex: [birth year] (81 y.o. Female) Treating RN: Afful, RN, BSN, Velva Harman Primary Care Physician: Threasa Alpha Other Clinician: Referring Physician: Threasa Alpha Treating Physician/Extender: Frann Rider in Treatment: 1 Vital Signs Time Taken: 13:42 Temperature (F): 97.8 Height (in):  63 Pulse (bpm): 75 Weight (lbs): 129 Respiratory Rate (breaths/min): 16 Body  Mass Index (BMI): 22.8 Blood Pressure (mmHg): 115/72 Reference Range: 80 - 120 mg / dl Electronic Signature(s) Signed: 07/16/2015 1:44:28 PM By: Regan Lemming BSN, RN Entered By: Regan Lemming on 07/16/2015 13:44:28

## 2015-07-17 NOTE — Progress Notes (Addendum)
LAIYANA, PANZERA ([id number]) Visit Report for 07/16/2015 Chief Complaint Document Details Patient Name: Jennifer Villarreal, Jennifer L. Date of Service: 07/16/2015 1:30 PM Medical Record Patient Account Number: [id number] Number: Afful, RN, BSN, Treating RN: 08-Aug-1931 (81 y.o. Velva Harman Date of Birth/Sex: Female) Other Clinician: Primary Care Physician: Threasa Alpha Treating Christin Fudge Referring Physician: Threasa Alpha Physician/Extender: Weeks in Treatment: 1 Information Obtained from: Caregiver Chief Complaint Patient presents to the wound care center for a consult due non healing wound. this 81 year old patient has mental retardation and is looked after by her sister who is her caregiver. I understand this has been an open wound for about 10 years now. Electronic Signature(s) Signed: 07/16/2015 2:07:45 PM By: Christin Fudge MD, FACS Entered By: Christin Fudge on 07/16/2015 14:07:45 Beltre, Bryanna L. ([id number]) -------------------------------------------------------------------------------- HPI Details Patient Name: Wittmann, Yovanna L. Date of Service: 07/16/2015 1:30 PM Medical Record Patient Account Number: [id number] Number: Afful, RN, BSN, Treating RN: 1931-02-04 (81 y.o. Velva Harman Date of Birth/Sex: Female) Other Clinician: Primary Care Physician: Threasa Alpha Treating Christin Fudge Referring Physician: Threasa Alpha Physician/Extender: Weeks in Treatment: 1 History of Present Illness Location: painless wound to the right lower extremity Quality: Patient reports No Pain. Severity: Patient states wound are getting worse. Duration: Patient states that they are not certain how long the wound has been present, but they feel it has been there for over 10 years Context: The wound appeared gradually over time Modifying Factors: Other treatment(s) tried include:Silvadene Associated Signs and Symptoms: Patient reports having difficulty standing for long periods. HPI  Description: An 81 year old patient has been sent to [place] by her PCP at [facility] family practice Dr. 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 There is a history of venous ulceration in the past. I understand a venous duplex of the right lower extremity was done which showed no DVT. the patient was put on Bactrim DS and Silvadene ointment. PAST MEDICAL HISTORY: 1.oooHypertension.2.oDiabetes mellitus type 2 .3.History of gastroesophageal reflux disease.4.oHistory of pulmonary embolism in August 2010 5.oHistory of displaced left femoral neck fracture in July 2011 status post prosthetic left femoral head replacement with AT Laurance Flatten prosthesis by Dr. Christophe Louis. o6.oHistory of being mentally handicapped. o most recent hemoglobin A1c done in October 2016 was 7.1 07/16/2015 -- the pathology report reveals that the patient has a squamous cell carcinoma. This was a wedge biopsy of the edges and hence she will need wide excision of this area. Electronic Signature(s) Signed: 07/16/2015 2:07:58 PM By: Christin Fudge MD, FACS Previous Signature: 07/16/2015 11:08:04 AM Version By: Christin Fudge MD, FACS Entered By: Christin Fudge on 07/16/2015 14:07:58 [place], Ladan L. ([id number]) -------------------------------------------------------------------------------- Physical Exam Details Patient Name: Bohnsack, Kimmy L. Date of Service: 07/16/2015 1:30 PM Medical Record Patient Account Number: [id number] Number: Afful, RN, BSN, Treating RN: [birth year] (81 y.o. Velva Harman Date of Birth/Sex: Female) Other Clinician: Primary Care Physician: Threasa Alpha Treating Christin Fudge Referring Physician: Threasa Alpha Physician/Extender: Weeks in Treatment: 1 Constitutional . Pulse regular. Respirations normal and unlabored. Afebrile. . Eyes Nonicteric. Reactive to light. Ears, Nose, Mouth, and Throat Lips, teeth, and gums WNL.Marland Kitchen Moist mucosa  without lesions . Neck supple and nontender. No palpable supraclavicular or cervical adenopathy. Normal sized without goiter. Respiratory WNL. No retractions.. Cardiovascular Pedal Pulses WNL. No clubbing, cyanosis or edema. Lymphatic No adneopathy. No adenopathy. No adenopathy. Musculoskeletal Adexa without tenderness or enlargement.. Digits and nails w/o clubbing, cyanosis, infection, petechiae, ischemia, or inflammatory conditions.. Integumentary (  Hair, Skin) No suspicious lesions. No crepitus or fluctuance. No peri-wound warmth or erythema. No masses.Marland Kitchen Psychiatric Judgement and insight Intact.. No evidence of depression, anxiety, or agitation.. Notes the wound in the right lower extremity is about the same and there is no changes. Electronic Signature(s) Signed: 07/16/2015 2:08:27 PM By: Christin Fudge MD, FACS Entered By: Christin Fudge on 07/16/2015 14:08:25 Pacifico, Phelan L. ([id number]) -------------------------------------------------------------------------------- Physician Orders Details Patient Name: Villarreal, Jennifer L. Date of Service: 07/16/2015 1:30 PM Medical Record Patient Account Number: [id number] Number: Afful, RN, BSN, Treating RN: 1931/04/29 (81 y.o. Velva Harman Date of Birth/Sex: Female) Other Clinician: Primary Care Physician: Threasa Alpha Treating Christin Fudge Referring Physician: Threasa Alpha Physician/Extender: [place] in Treatment: 1 Verbal / Phone Orders: Yes Clinician: Afful, RN, BSN, Rita Read Back and Verified: Yes Diagnosis Coding Wound Cleansing Wound #1 Right,Anterior Lower Leg o Clean wound with Normal Saline. Anesthetic Wound #1 Right,Anterior Lower Leg o Topical Lidocaine 4% cream applied to wound bed prior to debridement Skin Barriers/Peri-Wound Care Wound #1 Right,Anterior Lower Leg o Skin Prep Primary Wound Dressing Wound #1 Right,Anterior Lower Leg o Other: - siltec sorbact - item number [phone] Dressing Change  Frequency Wound #1 Right,Anterior Lower Leg o Change Dressing Monday, Wednesday, Friday Follow-up Appointments Wound #1 Right,Anterior Lower Leg o Return Appointment in 1 week. Home Health Wound #1 [place] Visits o Home Health Nurse may visit PRN to address patientos wound care needs. o FACE TO FACE ENCOUNTER: MEDICARE and MEDICAID PATIENTS: I certify that this patient is under my care and that I had a face-to-face encounter that meets the physician face-to-face encounter requirements with this patient on this date. The encounter with the patient was in whole or in part for the following MEDICAL CONDITION: (primary reason for [place]) MEDICAL NECESSITY: I certify, that based on my findings, NURSING services are a medically [place], [place] ([id number]) necessary home health service. HOME BOUND STATUS: I certify that my clinical findings support that this patient is homebound (i.e., Due to illness or injury, pt requires aid of supportive devices such as crutches, cane, wheelchairs, walkers, the use of special transportation or the assistance of another person to leave their place of residence. There is a normal inability to leave the home and doing so requires considerable and taxing effort. Other absences are for medical reasons / religious services and are infrequent or of short duration when for other reasons). o If current dressing causes regression in wound condition, may D/C ordered dressing product/s and apply Normal Saline Moist Dressing daily until next [place] / Other MD appointment. [place] of regression in wound condition at [phone]. o Please direct any NON-WOUND related issues/requests for orders to patient's Primary Care Physician Notes Will be referred to a local surgeon to address carcinoma. Electronic Signature(s) Signed: 07/16/2015 1:59:24 PM By: Regan Lemming BSN,  RN Signed: 07/16/2015 4:31:17 PM By: Christin Fudge MD, FACS Previous Signature: 07/16/2015 1:56:08 PM Version By: Regan Lemming BSN, RN Entered By: Regan Lemming on 07/16/2015 13:59:24 Few, Keiko L. ([id number]) -------------------------------------------------------------------------------- Problem List Details Patient Name: Selden, Enrika L. Date of Service: 07/16/2015 1:30 PM Medical Record Patient Account Number: [id number] Number: Afful, RN, BSN, Treating RN: Sep 27, 1930 (81 y.o. Velva Harman Date of Birth/Sex: Female) Other Clinician: Primary Care Physician: Teddy Spike Referring Physician: Threasa Alpha Physician/Extender: Weeks in Treatment: 1 Active Problems ICD-10 Encounter Code Description Active Date Diagnosis E11.622 Type 2 diabetes mellitus with other skin  ulcer 07/07/2015 Yes 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 layer 07/07/2015 Yes exposed F81.9 Developmental disorder of scholastic skills, unspecified 07/07/2015 Yes C44.722 Squamous cell carcinoma of skin of right lower limb, 07/16/2015 Yes including hip Inactive Problems Resolved Problems Electronic Signature(s) Signed: 07/16/2015 2:26:58 PM By: Christin Fudge MD, FACS Previous Signature: 07/16/2015 2:07:32 PM Version By: Christin Fudge MD, FACS Entered By: Christin Fudge on 07/16/2015 14:26:57 Gadomski, Thompson. ([id number]) -------------------------------------------------------------------------------- Progress Note Details Patient Name: Berrey, Keshawn L. Date of Service: 07/16/2015 1:30 PM Medical Record Patient Account Number: [id number] Number: Afful, RN, BSN, Treating RN: Aug 18, 1930 (81 y.o. Velva Harman Date of Birth/Sex: Female) Other Clinician: Primary Care Physician: Threasa Alpha Treating Christin Fudge Referring Physician: Threasa Alpha Physician/Extender: Weeks in Treatment: 1 Subjective Chief Complaint Information obtained from Caregiver Patient  presents to the wound care center for a consult due non healing wound. this 81 year old patient has mental retardation and is looked after by her sister who is her caregiver. I understand this has been an open wound for about 10 years now. History of Present Illness (HPI) The following HPI elements were documented for the patient's wound: Location: painless wound to the right lower extremity Quality: Patient reports No Pain. Severity: Patient states wound are getting worse. Duration: Patient states that they are not certain how long the wound has been present, but they feel it has been there for over 10 years Context: The wound appeared gradually over time Modifying Factors: Other treatment(s) tried include:Silvadene Associated Signs and Symptoms: Patient reports having difficulty standing for long periods. An 81 year old patient has been sent to [place] by her PCP at [facility] family practice Dr. 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 There is a history of venous ulceration in the past. I understand a venous duplex of the right lower extremity was done which showed no DVT. the patient was put on Bactrim DS and Silvadene ointment. PAST MEDICAL HISTORY: 1. Hypertension.2. Diabetes mellitus type 2 .3.History of gastroesophageal reflux disease.4. History of pulmonary embolism in August 2010 5. History of displaced left femoral neck fracture in July 2011 status post prosthetic left femoral head replacement with AT Laurance Flatten prosthesis by Dr. Christophe Louis. 6. History of being mentally handicapped. most recent hemoglobin A1c done in October 2016 was 7.1 07/16/2015 -- the pathology report reveals that the patient has a squamous cell carcinoma. This was a wedge biopsy of the edges and hence she will need wide excision of this area. [place], Azariah L. ([id number]) Objective Constitutional Pulse regular. Respirations normal and unlabored.  Afebrile. Vitals Time Taken: 1:42 PM, Height: 63 in, Weight: 129 lbs, BMI: 22.8, Temperature: 97.8 F, Pulse: 75 bpm, Respiratory Rate: 16 breaths/min, Blood Pressure: 115/72 mmHg. Eyes Nonicteric. Reactive to light. Ears, Nose, Mouth, and Throat Lips, teeth, and gums WNL.Marland Kitchen Moist mucosa without lesions . Neck supple and nontender. No palpable supraclavicular or cervical adenopathy. Normal sized without goiter. Respiratory WNL. No retractions.. Cardiovascular Pedal Pulses WNL. No clubbing, cyanosis or edema. Lymphatic No adneopathy. No adenopathy. No adenopathy. Musculoskeletal Adexa without tenderness or enlargement.. Digits and nails w/o clubbing, cyanosis, infection, petechiae, ischemia, or inflammatory conditions.Marland Kitchen Psychiatric Judgement and insight Intact.. No evidence of depression, anxiety, or agitation.. General Notes: the wound in the right lower extremity is about the same and there is no changes. Integumentary (Hair, Skin) No suspicious lesions. No crepitus or fluctuance. No peri-wound warmth or erythema. No masses.. Wound #1 status is Open. Original  cause of wound was Gradually Appeared. The wound is located on the Right,Anterior Lower Leg. The wound measures 2.2cm length x 1.5cm width x 0.1cm depth; 2.592cm^2 area and 0.259cm^3 volume. The wound is limited to skin breakdown. There is no tunneling or undermining noted. There is a medium amount of serous drainage noted. The wound margin is flat and intact. There is large (67-100%) red granulation within the wound bed. There is no necrotic tissue within the wound bed. The periwound skin appearance exhibited: Moist. The periwound skin appearance did not exhibit: Callus, Crepitus, Excoriation, Fluctuance, Friable, Induration, Localized Edema, Rash, Scarring, Dry/Scaly, Maceration, Atrophie Blanche, Cyanosis, Ecchymosis, Hemosiderin Staining, Mottled, Pallor, Rubor, Veazie, Jaymee L. ([id number]) Erythema. The periwound has  tenderness on palpation. 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 She is fully agreeable with the treatment plan and will elevate an appointment with the surgeon and take care of it. She will come back and see [place] if there are issues with the wound healing. I will personally talk to my surgical colleague Dr Susa Griffins and fix an appointment and make sure the patient gets the information regarding the appointment. Plan Wound Cleansing: Wound #1 Right,Anterior Lower Leg: Clean wound with Normal Saline. Anesthetic: Wound #1 Right,Anterior Lower Leg: Topical Lidocaine 4% cream applied to wound bed prior to debridement Skin Barriers/Peri-Wound Care: Wound #1 Right,Anterior Lower Leg: Skin Prep Primary Wound Dressing: Wound #1 Right,Anterior Lower Leg: Other: - siltec sorbact - item number [id number] Dressing Change Frequency: Wound #1 Right,Anterior Lower Leg: Change Dressing Monday, Wednesday, Friday Follow-up Appointments: Wound #1 Right,Anterior Lower Leg: Return Appointment in 1 week. MAIRI, KWIAT ([id number]) Home Health: Wound #1 Right,Anterior Lower Leg: [place] Nurse may visit PRN to address patient s wound care needs. FACE TO FACE ENCOUNTER: MEDICARE and MEDICAID PATIENTS: I certify that this patient is under my care and that I had a face-to-face encounter that meets the physician face-to-face encounter requirements with this patient on this date. The encounter with the patient  was in whole or in part for the following MEDICAL CONDITION: (primary reason for [place]) MEDICAL NECESSITY: I certify, that based on my findings, NURSING services are a medically necessary home health service. HOME BOUND STATUS: I certify that my clinical findings support that this patient is homebound (i.e., Due to illness or injury, pt requires aid of supportive devices such as crutches, cane, wheelchairs, walkers, the use of special transportation or the assistance of another person to leave their place of residence. There is a normal inability to leave the home and doing so requires considerable and taxing effort. Other absences are for medical reasons / religious services and are infrequent or of short duration when for other reasons). If current dressing causes regression in wound condition, may D/C ordered dressing product/s and apply Normal Saline Moist Dressing daily until next [place] / Other MD appointment. [place] of regression in wound condition at [phone]. Please direct any NON-WOUND related issues/requests for orders to patient's Primary Care Physician General Notes: Will be  referred to a local surgeon to address carcinoma. The sister, who is her caregiver and I, have had discussions regarding the pathology report and the fact that she needs a wide excision and possible primary closure in the operating room by a surgeon. She is fully agreeable with the treatment plan and will elevate an appointment with the surgeon and take care of it. She will come back and see [place] if there are issues with the wound healing. I will personally talk to my surgical colleague Dr Susa Griffins and fix an appointment and make sure the patient gets the information regarding the appointment. Electronic Signature(s) Signed: 07/16/2015 2:27:11 PM By: Christin Fudge MD, FACS Previous Signature: 07/16/2015 2:19:18 PM Version By: Christin Fudge MD, FACS Previous  Signature: 07/16/2015 2:11:14 PM Version By: Christin Fudge MD, FACS Entered By: Christin Fudge on 07/16/2015 14:27:10 Bias, Jerilyn L. ([id number]) -------------------------------------------------------------------------------- SuperBill Details Patient Name: Shadwick, Pamala L. Date of Service: 07/16/2015 Medical Record Patient Account Number: [id number] Number: Afful, RN, BSN, Treating RN: 08/21/1930 (81 y.o. Velva Harman Date of Birth/Sex: Female) Other Clinician: Primary Care Physician: Teddy Spike Referring Physician: Threasa Alpha Physician/Extender: Weeks in Treatment: 1 Diagnosis Coding ICD-10 Codes Code Description E11.622 Type 2 diabetes mellitus with other skin ulcer 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 layer exposed F81.9 Developmental disorder of scholastic skills, unspecified C44.722 Squamous cell carcinoma of skin of right lower limb, including hip Facility Procedures CPT4 Code: [id number] Description: 99213 - WOUND CARE VISIT-LEV 3 EST PT Modifier: Quantity: 1 Physician Procedures CPT4 Code Description: [id number] - WC PHYS LEVEL 3 - EST PT ICD-10 Description Diagnosis E11.622 Type 2 diabetes mellitus with other skin ulcer 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 F81.9 Developmental disorder of scholastic  skills, unspe C44.722 Squamous cell carcinoma of skin of right lower lim Modifier: layer exposed cified b, including hip Quantity: 1 Electronic Signature(s) Signed: 07/16/2015 2:18:17 PM By: Christin Fudge MD, FACS Entered By: Christin Fudge on 07/16/2015 14:18:17

## 2015-07-22 DIAGNOSIS — I10 Essential (primary) hypertension: Secondary | ICD-10-CM

## 2015-07-22 DIAGNOSIS — E785 Hyperlipidemia, unspecified: Secondary | ICD-10-CM

## 2015-07-22 DIAGNOSIS — I2699 Other pulmonary embolism without acute cor pulmonale: Secondary | ICD-10-CM

## 2015-07-22 DIAGNOSIS — K219 Gastro-esophageal reflux disease without esophagitis: Secondary | ICD-10-CM

## 2015-07-22 DIAGNOSIS — F79 Unspecified intellectual disabilities: Secondary | ICD-10-CM

## 2015-07-22 DIAGNOSIS — E119 Type 2 diabetes mellitus without complications: Secondary | ICD-10-CM

## 2015-07-22 HISTORY — DX: Essential (primary) hypertension: I10

## 2015-07-22 HISTORY — DX: Unspecified intellectual disabilities: F79

## 2015-07-22 HISTORY — DX: Gastro-esophageal reflux disease without esophagitis: K21.9

## 2015-07-22 HISTORY — DX: Type 2 diabetes mellitus without complications: E11.9

## 2015-07-22 HISTORY — DX: Hyperlipidemia, unspecified: E78.5

## 2015-07-24 ENCOUNTER — Ambulatory Visit (INDEPENDENT_AMBULATORY_CARE_PROVIDER_SITE_OTHER): Payer: Medicare Other | Admitting: General Surgery

## 2015-07-24 ENCOUNTER — Encounter: Payer: Self-pay | Admitting: General Surgery

## 2015-07-24 VITALS — BP 109/74 | HR 89 | Temp 98.9°F | Ht 63.0 in | Wt 125.0 lb

## 2015-07-24 DIAGNOSIS — C44722 Squamous cell carcinoma of skin of right lower limb, including hip: Secondary | ICD-10-CM | POA: Diagnosis not present

## 2015-07-24 NOTE — Progress Notes (Signed)
Patient ID: Jennifer Villarreal, female   DOB: 1931-05-16, 81 y.o.   MRN: [id number]  CC: RIGHT LOWER LEG WOUND  HPI Jennifer Villarreal is a 81 y.o. female sent to surgery clinic for evaluation of a chronic right lower [place] wound. Patient had the area biopsied last week which showed evidence of squamous cell carcinoma. Patient is mentally handicapped and is unable to participate in the history. Per her primary caregivers the wound has been present on and off for over 10 years. It has not changed in location or size. It does not have any spreading erythema or drainage. It has been undergoing local wound care per the wound care clinic and home health. They are here to discuss treatment options of this lower wound.  HPI  Past Medical History  Diagnosis Date  . Diabetes ([place]) 07/22/2015  . Hyperlipemia 07/22/2015  . HTN (hypertension) 07/22/2015  . GERD (gastroesophageal reflux disease) 07/22/2015  . Mental retardation 07/22/2015  . Acute pulmonary embolism ([place]) 10/27/2011    History reviewed. No pertinent past surgical history.  Family History  Problem Relation Age of Onset  . Hypertension Mother     Social History Social History  Substance Use Topics  . Smoking status: Never Smoker   . Smokeless tobacco: Never Used  . Alcohol Use: No    No Known Allergies  Current Outpatient Prescriptions  Medication Sig Dispense Refill  . atorvastatin (LIPITOR) 20 MG tablet Take 1 tablet by mouth at bedtime.    . donepezil (ARICEPT) 5 MG tablet Take 1 tablet by mouth daily.    Marland Kitchen glimepiride (AMARYL) 4 MG tablet Take 1 tablet by mouth 2 (two) times daily.    . hydrochlorothiazide (HYDRODIURIL) 25 MG tablet Take 1 tablet by mouth daily.    Marland Kitchen LANTUS SOLOSTAR 100 UNIT/ML Solostar Pen Inject 20 Units into the skin at bedtime.     Marland Kitchen LORazepam (ATIVAN) 1 MG tablet Take 1 tablet by mouth 2 (two) times daily.    . metFORMIN (GLUCOPHAGE) 1000 MG tablet Take 1 tablet by mouth 2 (two) times daily.    . metoprolol  succinate (TOPROL-XL) 50 MG 24 hr tablet Take 1 tablet by mouth daily.    Marland Kitchen omeprazole (PRILOSEC) 20 MG capsule Take 1 capsule by mouth 2 (two) times daily.    . valsartan (DIOVAN) 40 MG tablet Take 1 tablet by mouth daily.     No current facility-administered medications for this visit.     Review of Systems A review of systems was impossible to complete with the patient due to her mental handicap  Physical Exam Blood pressure 109/74, pulse 89, temperature 98.9 F (37.2 C), temperature source Oral, height 5\' 3"  (1.6 m), weight 56.7 kg (125 lb). CONSTITUTIONAL: No acute distress. EYES: Pupils are equal, round, and reactive to light, Sclera are non-icteric. EARS, NOSE, MOUTH AND THROAT: The oropharynx is clear. The oral mucosa is pink and moist. Hearing is intact to voice. LYMPH NODES:  Lymph nodes in the neck are normal. RESPIRATORY:  Lungs are clear. There is normal respiratory effort, with equal breath sounds bilaterally, and without pathologic use of accessory muscles. CARDIOVASCULAR: Heart is regular without murmurs, gallops, or rubs. GI: The abdomen is thin, soft, nontender, and nondistended. There are no palpable masses. There is no hepatosplenomegaly. There are normal bowel sounds in all quadrants. GU: Rectal deferred.   MUSCULOSKELETAL: Normal muscle strength and tone. No cyanosis or edema.   SKIN: 2.5 x 1.7 cm chronic, raised area of granulation  tissue to the mid anterior tibial right lower extremity. No evidence of infection or bleeding. NEUROLOGIC: Motor and sensation is grossly normal. Cranial nerves are grossly intact. PSYCH:  Oriented to person, place and time. Affect is normal.  Data Reviewed Recent biopsy results reviewed showing consistent with squamous cell carcinoma. I have personally reviewed the patient's imaging, laboratory findings and medical records.    Assessment    81 year old female with right lower extremity squamous cell carcinoma.    Plan     Discussed with patient and her family members the diagnosis squamous cell carcinoma. Patient is unable to participate in the conversation about her treatment options. Her primary caregiver is not really interested in putting her through a general anesthetic for a wide excision of a skin cancer that will likely never heal. She has numerous medical conditions in conjunction with her mental disabilities and the patient will not understand what is happening to her. Discussed that since area has not changed in size in many years that the area were to start to grow and surgical excision and further evaluation would be warranted. They all voice understanding of this and desired to proceed with a wakeful watching approach. I will follow-up as needed     Time spent with the patient was 45 minutes, with more than 50% of the time spent in face-to-face education, counseling and care coordination.     Clayburn Pert, MD FACS General Surgeon 07/24/2015, 12:37 PM

## 2015-07-24 NOTE — Patient Instructions (Signed)
You have been seen for a Skin Cancer (Squamous Cell Cancer).   If this area stays this size and does not get any worse, then keep doing what you are doing with the dressing.  If this starts to grow faster, please let us know and we will see you back and talk about removing this area.

## 2016-09-16 ENCOUNTER — Emergency Department: Payer: Medicare Other

## 2016-09-16 ENCOUNTER — Encounter: Payer: Self-pay | Admitting: Emergency Medicine

## 2016-09-16 ENCOUNTER — Observation Stay
Admission: EM | Admit: 2016-09-16 | Discharge: 2016-09-21 | Disposition: A | Discharge status: Skilled Nursing Facility | Payer: Medicare Other | Attending: Internal Medicine | Admitting: Internal Medicine

## 2016-09-16 DIAGNOSIS — Z66 Do not resuscitate: Secondary | ICD-10-CM | POA: Insufficient documentation

## 2016-09-16 DIAGNOSIS — Z86711 Personal history of pulmonary embolism: Secondary | ICD-10-CM | POA: Insufficient documentation

## 2016-09-16 DIAGNOSIS — R531 Weakness: Secondary | ICD-10-CM | POA: Diagnosis not present

## 2016-09-16 DIAGNOSIS — E785 Hyperlipidemia, unspecified: Secondary | ICD-10-CM | POA: Insufficient documentation

## 2016-09-16 DIAGNOSIS — W06XXXA Fall from bed, initial encounter: Secondary | ICD-10-CM | POA: Insufficient documentation

## 2016-09-16 DIAGNOSIS — R4182 Altered mental status, unspecified: Secondary | ICD-10-CM | POA: Diagnosis not present

## 2016-09-16 DIAGNOSIS — W19XXXA Unspecified fall, initial encounter: Secondary | ICD-10-CM | POA: Insufficient documentation

## 2016-09-16 DIAGNOSIS — E119 Type 2 diabetes mellitus without complications: Secondary | ICD-10-CM | POA: Insufficient documentation

## 2016-09-16 DIAGNOSIS — E876 Hypokalemia: Secondary | ICD-10-CM | POA: Insufficient documentation

## 2016-09-16 DIAGNOSIS — R262 Difficulty in walking, not elsewhere classified: Principal | ICD-10-CM | POA: Diagnosis present

## 2016-09-16 DIAGNOSIS — Z820 Family history of epilepsy and other diseases of the nervous system: Secondary | ICD-10-CM | POA: Insufficient documentation

## 2016-09-16 DIAGNOSIS — I700 Atherosclerosis of aorta: Secondary | ICD-10-CM | POA: Insufficient documentation

## 2016-09-16 DIAGNOSIS — E860 Dehydration: Secondary | ICD-10-CM | POA: Diagnosis not present

## 2016-09-16 DIAGNOSIS — F72 Severe intellectual disabilities: Secondary | ICD-10-CM | POA: Diagnosis not present

## 2016-09-16 DIAGNOSIS — I10 Essential (primary) hypertension: Secondary | ICD-10-CM | POA: Insufficient documentation

## 2016-09-16 DIAGNOSIS — Z794 Long term (current) use of insulin: Secondary | ICD-10-CM | POA: Insufficient documentation

## 2016-09-16 DIAGNOSIS — K219 Gastro-esophageal reflux disease without esophagitis: Secondary | ICD-10-CM | POA: Insufficient documentation

## 2016-09-16 DIAGNOSIS — Z8249 Family history of ischemic heart disease and other diseases of the circulatory system: Secondary | ICD-10-CM | POA: Diagnosis not present

## 2016-09-16 DIAGNOSIS — R630 Anorexia: Secondary | ICD-10-CM | POA: Insufficient documentation

## 2016-09-16 DIAGNOSIS — Z7982 Long term (current) use of aspirin: Secondary | ICD-10-CM | POA: Insufficient documentation

## 2016-09-16 DIAGNOSIS — R296 Repeated falls: Secondary | ICD-10-CM | POA: Insufficient documentation

## 2016-09-16 DIAGNOSIS — M8580 Other specified disorders of bone density and structure, unspecified site: Secondary | ICD-10-CM | POA: Insufficient documentation

## 2016-09-16 DIAGNOSIS — Z9181 History of falling: Secondary | ICD-10-CM | POA: Insufficient documentation

## 2016-09-16 DIAGNOSIS — R000 Tachycardia, unspecified: Secondary | ICD-10-CM | POA: Insufficient documentation

## 2016-09-16 LAB — BASIC METABOLIC PANEL
ANION GAP: 11 (ref 5–15)
BUN: 32 mg/dL — ABNORMAL HIGH (ref 6–20)
CALCIUM: 9.5 mg/dL (ref 8.9–10.3)
CO2: 28 mmol/L (ref 22–32)
Chloride: 98 mmol/L — ABNORMAL LOW (ref 101–111)
Creatinine, Ser: 0.49 mg/dL (ref 0.44–1.00)
Glucose, Bld: 213 mg/dL — ABNORMAL HIGH (ref 65–99)
Potassium: 3.4 mmol/L — ABNORMAL LOW (ref 3.5–5.1)
Sodium: 137 mmol/L (ref 135–145)

## 2016-09-16 LAB — URINALYSIS, COMPLETE (UACMP) WITH MICROSCOPIC
BILIRUBIN URINE: NEGATIVE
Bacteria, UA: NONE SEEN
GLUCOSE, UA: 150 mg/dL — AB
HGB URINE DIPSTICK: NEGATIVE
KETONES UR: 5 mg/dL — AB
LEUKOCYTES UA: NEGATIVE
NITRITE: NEGATIVE
PH: 6 (ref 5.0–8.0)
Protein, ur: NEGATIVE mg/dL
Specific Gravity, Urine: 1.012 (ref 1.005–1.030)
Squamous Epithelial / LPF: NONE SEEN

## 2016-09-16 LAB — CBC
HEMATOCRIT: 35.7 % (ref 35.0–47.0)
Hemoglobin: 12.2 g/dL (ref 12.0–16.0)
MCH: 29.5 pg (ref 26.0–34.0)
MCHC: 34 g/dL (ref 32.0–36.0)
MCV: 86.8 fL (ref 80.0–100.0)
Platelets: 283 10*3/uL (ref 150–440)
RBC: 4.12 MIL/uL (ref 3.80–5.20)
RDW: 14.7 % — ABNORMAL HIGH (ref 11.5–14.5)
WBC: 4.9 10*3/uL (ref 3.6–11.0)

## 2016-09-16 LAB — HEPATIC FUNCTION PANEL
ALBUMIN: 3.8 g/dL (ref 3.5–5.0)
ALT: 21 U/L (ref 14–54)
AST: 28 U/L (ref 15–41)
Alkaline Phosphatase: 45 U/L (ref 38–126)
BILIRUBIN TOTAL: 0.9 mg/dL (ref 0.3–1.2)
Bilirubin, Direct: 0.3 mg/dL (ref 0.1–0.5)
Indirect Bilirubin: 0.6 mg/dL (ref 0.3–0.9)
TOTAL PROTEIN: 7.1 g/dL (ref 6.5–8.1)

## 2016-09-16 LAB — BRAIN NATRIURETIC PEPTIDE: B NATRIURETIC PEPTIDE 5: 91 pg/mL (ref 0.0–100.0)

## 2016-09-16 LAB — GLUCOSE, CAPILLARY: GLUCOSE-CAPILLARY: 164 mg/dL — AB (ref 65–99)

## 2016-09-16 MED ORDER — DOCUSATE SODIUM 100 MG PO CAPS
100.0000 mg | ORAL_CAPSULE | Freq: Two times a day (BID) | ORAL | Status: DC
  Administered 2016-09-16 – 2016-09-21 (×10): 100 mg via ORAL
  Filled 2016-09-16 (×10): qty 1

## 2016-09-16 MED ORDER — IRBESARTAN 75 MG PO TABS
37.5000 mg | ORAL_TABLET | Freq: Every day | ORAL | Status: DC
  Administered 2016-09-17 – 2016-09-21 (×5): 37.5 mg via ORAL
  Filled 2016-09-16 (×4): qty 1
  Filled 2016-09-16: qty 0.5
  Filled 2016-09-16: qty 1

## 2016-09-16 MED ORDER — ONDANSETRON HCL 4 MG PO TABS: 4.0000 mg | ORAL_TABLET | Freq: Four times a day (QID) | ORAL | Status: DC | PRN

## 2016-09-16 MED ORDER — HEPARIN SODIUM (PORCINE) 5000 UNIT/ML IJ SOLN
5000.0000 [IU] | Freq: Three times a day (TID) | INTRAMUSCULAR | Status: DC
  Administered 2016-09-16 – 2016-09-19 (×9): 5000 [IU] via SUBCUTANEOUS
  Filled 2016-09-16 (×9): qty 1

## 2016-09-16 MED ORDER — ATORVASTATIN CALCIUM 20 MG PO TABS
20.0000 mg | ORAL_TABLET | Freq: Every day | ORAL | Status: DC
  Administered 2016-09-17 – 2016-09-20 (×4): 20 mg via ORAL
  Filled 2016-09-16 (×4): qty 1

## 2016-09-16 MED ORDER — METOPROLOL SUCCINATE ER 50 MG PO TB24
100.0000 mg | ORAL_TABLET | Freq: Every day | ORAL | Status: DC
Start: 2016-09-16 — End: 2016-09-21
  Administered 2016-09-16: 100 mg via ORAL
  Administered 2016-09-17: 50 mg via ORAL
  Administered 2016-09-18 – 2016-09-21 (×4): 100 mg via ORAL
  Filled 2016-09-16 (×7): qty 2

## 2016-09-16 MED ORDER — SODIUM CHLORIDE 0.9 % IV BOLUS (SEPSIS)
1000.0000 mL | Freq: Once | INTRAVENOUS | Status: AC
  Administered 2016-09-16: 1000 mL via INTRAVENOUS

## 2016-09-16 MED ORDER — ONDANSETRON HCL 4 MG/2ML IJ SOLN: 4.0000 mg | Freq: Four times a day (QID) | INTRAMUSCULAR | Status: DC | PRN

## 2016-09-16 MED ORDER — SODIUM CHLORIDE 0.9 % IV SOLN
INTRAVENOUS | Status: DC
  Administered 2016-09-16: 23:00:00 via INTRAVENOUS

## 2016-09-16 MED ORDER — GLIMEPIRIDE 2 MG PO TABS
4.0000 mg | ORAL_TABLET | Freq: Two times a day (BID) | ORAL | Status: DC
  Administered 2016-09-16 – 2016-09-21 (×10): 4 mg via ORAL
  Filled 2016-09-16 (×2): qty 2
  Filled 2016-09-16: qty 1
  Filled 2016-09-16 (×2): qty 2
  Filled 2016-09-16 (×2): qty 1
  Filled 2016-09-16 (×3): qty 2

## 2016-09-16 MED ORDER — POTASSIUM CHLORIDE CRYS ER 20 MEQ PO TBCR
10.0000 meq | EXTENDED_RELEASE_TABLET | Freq: Every day | ORAL | Status: DC
  Administered 2016-09-16 – 2016-09-21 (×6): 10 meq via ORAL
  Filled 2016-09-16 (×6): qty 1

## 2016-09-16 MED ORDER — LORAZEPAM 0.5 MG PO TABS
0.5000 mg | ORAL_TABLET | Freq: Every day | ORAL | Status: DC
  Administered 2016-09-16 – 2016-09-20 (×5): 0.5 mg via ORAL
  Filled 2016-09-16 (×5): qty 1

## 2016-09-16 MED ORDER — ACETAMINOPHEN 650 MG RE SUPP: 650.0000 mg | Freq: Four times a day (QID) | RECTAL | Status: DC | PRN

## 2016-09-16 MED ORDER — PANTOPRAZOLE SODIUM 40 MG PO TBEC
40.0000 mg | DELAYED_RELEASE_TABLET | Freq: Every day | ORAL | Status: DC
  Administered 2016-09-16 – 2016-09-21 (×6): 40 mg via ORAL
  Filled 2016-09-16 (×6): qty 1

## 2016-09-16 MED ORDER — METFORMIN HCL 500 MG PO TABS
1000.0000 mg | ORAL_TABLET | Freq: Two times a day (BID) | ORAL | Status: DC
  Administered 2016-09-17 – 2016-09-21 (×9): 1000 mg via ORAL
  Filled 2016-09-16 (×9): qty 2

## 2016-09-16 MED ORDER — ACETAMINOPHEN 325 MG PO TABS: 650.0000 mg | ORAL_TABLET | Freq: Four times a day (QID) | ORAL | Status: DC | PRN

## 2016-09-16 MED ORDER — SODIUM CHLORIDE 0.9 % IV BOLUS (SEPSIS)
1000.0000 mL | Freq: Once | INTRAVENOUS | Status: AC
Start: 2016-09-16 — End: 2016-09-16
  Administered 2016-09-16: 1000 mL via INTRAVENOUS

## 2016-09-16 MED ORDER — IOPAMIDOL (ISOVUE-370) INJECTION 76%
75.0000 mL | Freq: Once | INTRAVENOUS | Status: AC | PRN
  Administered 2016-09-16: 75 mL via INTRAVENOUS

## 2016-09-16 NOTE — ED Triage Notes (Signed)
Per [facility], patient comes from home after a fall she had yesterday. Patient hit her head. Family called EMS yesterday then refused transport. Today, patient woke up with unsteady gait, "more than normal". Patient only answers yes or no to questions, this is patients baseline per family. Patient denies pain.

## 2016-09-16 NOTE — Plan of Care (Signed)
Problem: Education: Goal: Knowledge of [facility] Education information/materials will improve Outcome: Progressing Pt likes to be called Jennifer Villarreal  Past Medical History:  Diagnosis Date  . Acute pulmonary embolism ([place]) 10/27/2011  . Diabetes ([place]) 07/22/2015  . GERD (gastroesophageal reflux disease) 07/22/2015  . HTN (hypertension) 07/22/2015  . Hyperlipemia 07/22/2015  . Mental retardation 07/22/2015   Pt is well controlled with home medications

## 2016-09-16 NOTE — H&P (Signed)
[facility] NAME: Jennifer Villarreal    MR#:  [id number]  DATE OF BIRTH:  20-Aug-1930  DATE OF ADMISSION:  09/16/2016  PRIMARY CARE PHYSICIAN: Vista Mink, FNP   REQUESTING/REFERRING PHYSICIAN: Dr,Norman .  CHIEF COMPLAINT:   Chief Complaint  Patient presents with  . Weakness    HISTORY OF PRESENT ILLNESS:  Jennifer Villarreal  is a 83 y.o. female with a known history of Mental retardation with IQ of 14-year-old comes in because of generalized weakness, multiple falls. Patient usually ambulatory with a walker but the according to patient's sister who is a caregiver for the last 50 years, she tells that patient is not acting right for the past few days, patient had fall 2 times yesterday evening. No fever, no cough. Because of multiple falls family brought her here in the emergency room ER physician evaluated, CT head, x-rays of the hip unremarkable. But patient is still unable to walk, concerning this we are going to keep her overnight get physical therapy to see the patient.Marland Kitchen PAST MEDICAL HISTORY:   Past Medical History:  Diagnosis Date  . Acute pulmonary embolism ([place]) 10/27/2011  . Diabetes ([place]) 07/22/2015  . GERD (gastroesophageal reflux disease) 07/22/2015  . HTN (hypertension) 07/22/2015  . Hyperlipemia 07/22/2015  . Mental retardation 07/22/2015    PAST SURGICAL HISTOIRY:  History reviewed. No pertinent surgical history.  SOCIAL HISTORY:   Social History  Substance Use Topics  . Smoking status: Never Smoker  . Smokeless tobacco: Never Used  . Alcohol use No    FAMILY HISTORY:   Family History  Problem Relation Age of Onset  . Hypertension Mother     DRUG ALLERGIES:  No Known Allergies  REVIEW OF SYSTEMS:   Not able to give a review of systems because of her mental retardation  MEDICATIONS AT HOME:   Prior to Admission medications   Medication Sig Start Date End Date Taking? Authorizing Provider   atorvastatin (LIPITOR) 20 MG tablet Take 1 tablet by mouth at bedtime. 07/07/15   Historical Provider, MD  donepezil (ARICEPT) 5 MG tablet Take 1 tablet by mouth daily. 06/02/15   Historical Provider, MD  glimepiride (AMARYL) 4 MG tablet Take 1 tablet by mouth 2 (two) times daily. 07/01/15   Historical Provider, MD  hydrochlorothiazide (HYDRODIURIL) 25 MG tablet Take 1 tablet by mouth daily. 07/01/15   Historical Provider, MD  LANTUS SOLOSTAR 100 UNIT/ML Solostar Pen Inject 20 Units into the skin at bedtime.  07/01/15   Historical Provider, MD  LORazepam (ATIVAN) 1 MG tablet Take 1 tablet by mouth 2 (two) times daily. 07/02/15   Historical Provider, MD  metFORMIN (GLUCOPHAGE) 1000 MG tablet Take 1 tablet by mouth 2 (two) times daily. 06/11/15   Historical Provider, MD  metoprolol succinate (TOPROL-XL) 50 MG 24 hr tablet Take 1 tablet by mouth daily. 07/01/15   Historical Provider, MD  omeprazole (PRILOSEC) 20 MG capsule Take 1 capsule by mouth 2 (two) times daily. 05/02/15   Historical Provider, MD  valsartan (DIOVAN) 40 MG tablet Take 1 tablet by mouth daily. 07/01/15   Historical Provider, MD      VITAL SIGNS:  Blood pressure 122/70, pulse (!) 123, temperature 98 F (36.7 C), temperature source Oral, resp. rate 17, height 5\' 2"  (1.575 m), weight 59 kg (130 lb), SpO2 95 %.  PHYSICAL EXAMINATION:  GENERAL:  83 y.o.-year-old patient lying in the bed with no acute distress.  EYES: Pupils equal, round,  reactive to light and accommodation. No scleral icterus. Extraocular muscles intact.  HEENT: Head atraumatic, normocephalic. Oropharynx and nasopharynx clear.  NECK:  Supple, no jugular venous distention. No thyroid enlargement, no tenderness.  LUNGS: Normal breath sounds bilaterally, no wheezing, rales,rhonchi or crepitation. No use of accessory muscles of respiration.  CARDIOVASCULAR: S1, S2 normal. No murmurs, rubs, or gallops.  ABDOMEN: Soft, nontender, nondistended. Bowel sounds present. No  organomegaly or mass.  EXTREMITIES: No pedal edema, cyanosis, or clubbing.  NEUROLOGIC: Patient is awake but doesn't talk much, hard of hearing because of mental   Retardation unable to follow directions for neurological exam. PSYCHIATRIC: The patient is alert   SKIN: No obvious rash, lesion, or ulcer.   LABORATORY PANEL:   CBC No results for input(s): WBC, HGB, HCT, PLT in the last 168 hours. ------------------------------------------------------------------------------------------------------------------  Chemistries   Recent Labs Lab 09/16/16 1440  NA 137  K 3.4*  CL 98*  CO2 28  GLUCOSE 213*  BUN 32*  CREATININE 0.49  CALCIUM 9.5   ------------------------------------------------------------------------------------------------------------------  Cardiac Enzymes No results for input(s): TROPONINI in the last 168 hours. ------------------------------------------------------------------------------------------------------------------  RADIOLOGY:  Ct Angio Head W Or Wo Contrast  Result Date: 09/16/2016 CLINICAL DATA:  Dizziness.  Fall.  Unsteady gait. EXAM: CT ANGIOGRAPHY HEAD AND NECK TECHNIQUE: Multidetector CT imaging of the head and neck was performed using the standard protocol during bolus administration of intravenous contrast. Multiplanar CT image reconstructions and MIPs were obtained to evaluate the vascular anatomy. Carotid stenosis measurements (when applicable) are obtained utilizing NASCET criteria, using the distal internal carotid diameter as the denominator. CONTRAST:  75 mL Isovue 370 IV COMPARISON:  CT head 05/04/2011 FINDINGS: CT HEAD FINDINGS Brain: Moderate atrophy. Mild chronic microvascular ischemic change in the white matter. Negative for acute infarct. Negative for hemorrhage or mass. Vascular: No hyperdense vessel or unexpected calcification. Skull: Negative for fracture. Sinuses: Mild mucosal edema in the paranasal sinuses. Orbits: Bilateral ocular  surgery.  Negative for mass in the orbit. Review of the MIP images confirms the above findings CTA NECK FINDINGS Aortic arch: Mild atherosclerotic calcification in the aortic arch. Proximal great vessels patent. Right carotid system: Right carotid bifurcation widely patent without stenosis or dissection Left carotid system: Mild atherosclerotic plaque at the left carotid bifurcation without significant stenosis or dissection. Vertebral arteries: Calcific plaque at the origin of the left vertebral artery causing moderate stenosis. Left vertebral artery is patent to the basilar. Right vertebral artery widely patent throughout its course. 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 Multilevel lumbar disc degeneration and spurring. No acute skeletal abnormality. Other neck: Negative for mass or adenopathy in the neck. Upper chest: Lung apices clear. Review of the MIP images confirms the above findings CTA HEAD FINDINGS Anterior circulation: Mild atherosclerotic calcification in the cavernous carotid on the right. No significant carotid stenosis. Anterior and middle cerebral arteries widely patent Posterior circulation: Both vertebral arteries patent to the basilar. Left PICA patent. Right PICA not visualized. AICA, superior cerebellar, and posterior cerebral arteries patent bilaterally. Basilar normal. Venous sinuses: Patent Anatomic variants: Negative for cerebral aneurysm Delayed phase: Normal enhancement on delayed imaging. Review of the MIP images confirms the above findings IMPRESSION: Atrophy and chronic microvascular ischemia. No acute intracranial abnormality. No significant carotid stenosis. Moderate stenosis at the origin of left vertebral artery due to calcific plaque. Electronically Signed   By: Franchot Gallo M.D.   On: 09/16/2016 17:14   Dg Chest 2 View  Result Date: 09/16/2016 CLINICAL DATA:  Falls.  Unsteady gait.  Initial encounter. EXAM: CHEST  2 VIEW COMPARISON:  07/25/2012  FINDINGS: Normal heart size. Mitral annular ossification. Stable mild aortic tortuosity. Aortic atherosclerosis. There is no edema, consolidation, effusion, or pneumothorax. Osteopenia. No acute osseous finding. IMPRESSION: No active cardiopulmonary disease. Electronically Signed   By: Monte Fantasia M.D.   On: 09/16/2016 18:27   Ct Angio Neck W And/or Wo Contrast  Result Date: 09/16/2016 CLINICAL DATA:  Dizziness.  Fall.  Unsteady gait. EXAM: CT ANGIOGRAPHY HEAD AND NECK TECHNIQUE: Multidetector CT imaging of the head and neck was performed using the standard protocol during bolus administration of intravenous contrast. Multiplanar CT image reconstructions and MIPs were obtained to evaluate the vascular anatomy. Carotid stenosis measurements (when applicable) are obtained utilizing NASCET criteria, using the distal internal carotid diameter as the denominator. CONTRAST:  75 mL Isovue 370 IV COMPARISON:  CT head 05/04/2011 FINDINGS: CT HEAD FINDINGS Brain: Moderate atrophy. Mild chronic microvascular ischemic change in the white matter. Negative for acute infarct. Negative for hemorrhage or mass. Vascular: No hyperdense vessel or unexpected calcification. Skull: Negative for fracture. Sinuses: Mild mucosal edema in the paranasal sinuses. Orbits: Bilateral ocular surgery.  Negative for mass in the orbit. Review of the MIP images confirms the above findings CTA NECK FINDINGS Aortic arch: Mild atherosclerotic calcification in the aortic arch. Proximal great vessels patent. Right carotid system: Right carotid bifurcation widely patent without stenosis or dissection Left carotid system: Mild atherosclerotic plaque at the left carotid bifurcation without significant stenosis or dissection. Vertebral arteries: Calcific plaque at the origin of the left vertebral artery causing moderate stenosis. Left vertebral artery is patent to the basilar. Right vertebral artery widely patent throughout its course. 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 Multilevel lumbar disc degeneration and spurring. No acute skeletal abnormality. Other neck: Negative for mass or adenopathy in the neck. Upper chest: Lung apices clear. Review of the MIP images confirms the above findings CTA HEAD FINDINGS Anterior circulation: Mild atherosclerotic calcification in the cavernous carotid on the right. No significant carotid stenosis. Anterior and middle cerebral arteries widely patent Posterior circulation: Both vertebral arteries patent to the basilar. Left PICA patent. Right PICA not visualized. AICA, superior cerebellar, and posterior cerebral arteries patent bilaterally. Basilar normal. Venous sinuses: Patent Anatomic variants: Negative for cerebral aneurysm Delayed phase: Normal enhancement on delayed imaging. Review of the MIP images confirms the above findings IMPRESSION: Atrophy and chronic microvascular ischemia. No acute intracranial abnormality. No significant carotid stenosis. Moderate stenosis at the origin of left vertebral artery due to calcific plaque. Electronically Signed   By: Franchot Gallo M.D.   On: 09/16/2016 17:14   Dg Hips Bilat W Or Wo Pelvis 3-4 Views  Result Date: 09/16/2016 CLINICAL DATA:  Dizziness and fall. Per [facility], patient comes from home after a fall she had yesterday. Patient hit her head. Family called EMS yesterday then refused transport. Today, patient woke up with unsteady gait, "more than normal". EXAM: DG HIP (WITH OR WITHOUT PELVIS) 3-4V BILAT COMPARISON:  None. FINDINGS: No evidence for acute fracture or subluxation. Left hip hemiarthroplasty is well seated. Contrast is identified the distal ureters and urinary bladder following contrast laceration. Coarse calcification in the central pelvis is consistent with a fibroid. Bones appear osteopenic. IMPRESSION: No evidence for acute  abnormality. Electronically Signed   By: Nolon Nations M.D.   On: 09/16/2016 17:10    EKG:   Orders  placed or performed during the hospital encounter of 09/16/16  .  ED EKG  . ED EKG  EKG shows sinus tachycardia 1 16 bpm..  IMPRESSION AND PLAN:   [age] year [age] female patient with multiple falls, ambulatory difficulty: Admit observation status, physical therapy evaluation. According to the family no change in medications recently. Ativan dose has been decreased from 1 mg to 2.5 mg. Sister is concerned about her falls, not following the direction that she used to do before. Patient has severe mental retardation and also has  iq  35-year-old. Labs look within normal limits except low potassium. Hydration overnight, physical therapy to see the patient.  no Evidence of urine infection. #2 diabetes mellitus type 2: Patient on multiple medications including metformin, glipizide, Lantus. Hold the Lantus tonight, continue other medication resume Lantus from tomorrow because patient not eaten well today.  #3 essential hypertension  #4 history of carotid disease: Patient is on beta blockers.,statins #5 discussed the CODE STATUS the patient's sister confirmed the DO NOT RESUSCITATE status.    All the records are reviewed and case discussed with ED provider. Management plans discussed with the patient, family and they are in agreement.  CODE STATUS: DNR  TOTAL TIME TAKING CARE OF THIS PATIENT: 55  minutes.    Epifanio Lesches M.D on 09/16/2016 at 7:02 PM  Between 7am to 6pm - Pager - [phone]  After 6pm go to [email or web address] - password [email or web address] [facility]  Office  [phone]  CC: Primary care physician; Vista Mink, FNP  Note: This dictation was prepared with Dragon dictation along with smaller phrase technology. Any transcriptional errors that result from this process are unintentional.

## 2016-09-16 NOTE — ED Notes (Signed)
Report from [place], rn.

## 2016-09-16 NOTE — ED Provider Notes (Addendum)
[facility] Emergency Department Provider Note  ____________________________________________  Time seen: Approximately 5:33 PM  I have reviewed the triage vital signs and the nursing notes.   HISTORY  Chief Complaint Weakness    HPI Jennifer Villarreal is a 83 y.o. female with a history of mental retardation cared for by her little sister, HTN, HL, DM, remote PE not currently anticoagulated presenting for 2 falls and inability to walk. The patient is unable to give any history herself due to her mental retardation and limited verbal abilities. Her sister describes that she heard her sister fall yesterday evening in the bathroom, with no evidence of loss of consciousness but did have some erythema over the forehead. They were able to get her back to bed, and at some point during the night she fell out of bed. EMS was called and there was no apparent injury so the mattress was placed on the ground. This morning, this patient who usually walks with a walker, wasn't completely unable to get up. She complained of bilateral hip pain, but then denied this several: She also complained of bilateral arm pain, but then denied this as well. Over the past 3 days, she has also had transient episodes of altered mental status where she did not recognize her sister. She was seen by her PMD who reportedly told the family that these changes may be due to "blockage of an artery in her neck, but she is not a candidate for surgery." No recent illness, cough or cold symptoms, fever, nausea vomiting or diarrhea, or change in medications.   Past Medical History:  Diagnosis Date  . Acute pulmonary embolism ([place]) 10/27/2011  . Diabetes ([place]) 07/22/2015  . GERD (gastroesophageal reflux disease) 07/22/2015  . HTN (hypertension) 07/22/2015  . Hyperlipemia 07/22/2015  . Mental retardation 07/22/2015    Patient Active Problem List   Diagnosis Date Noted  . Squamous cell carcinoma of right lower leg  07/24/2015  . Diabetes ([place]) 07/22/2015  . Hyperlipemia 07/22/2015  . HTN (hypertension) 07/22/2015  . GERD (gastroesophageal reflux disease) 07/22/2015  . Mental retardation 07/22/2015  . Acute pulmonary embolism ([place]) 10/27/2011    History reviewed. No pertinent surgical history.  Current Outpatient Rx  . Order #: [id number] Class: Historical Med  . Order #: [id number] Class: Historical Med  . Order #: [id number] Class: Historical Med  . Order #: [id number] Class: Historical Med  . Order #: [id number] Class: Historical Med  . Order #: [id number] Class: Historical Med  . Order #: [id number] Class: Historical Med  . Order #: [id number] Class: Historical Med  . Order #: [id number] Class: Historical Med  . Order #: [id number] Class: Historical Med    Allergies Patient has no known allergies.  Family History  Problem Relation Age of Onset  . Hypertension Mother     Social History Social History  Substance Use Topics  . Smoking status: Never Smoker  . Smokeless tobacco: Never Used  . Alcohol use No    Review of Systems Limited due to patient inability to communicate. Positive for fall. Positive for complaining of bilateral hip pain. Positive for head trauma. Negative for loss of consciousness. ____________________________________________   PHYSICAL EXAM:  VITAL SIGNS: ED Triage Vitals  Enc Vitals Group     BP 09/16/16 1433 (!) 144/78     Pulse Rate 09/16/16 1433 (!) 117     Resp 09/16/16 1433 16     Temp 09/16/16 1433 98 F (36.7 C)     Temp Source 09/16/16 1433 Oral  SpO2 09/16/16 1433 100 %     Weight 09/16/16 1434 130 lb (59 kg)     Height 09/16/16 1434 5\' 2"  (1.575 m)     Head Circumference --      Peak Flow --      Pain Score --      Pain Loc --      Pain Edu? --      Excl. in [place]? --     Constitutional: The patient is alert, pleasant and comfortable appearing area she makes good eye contact but does not give any verbal responses. She follows basic commands. Eyes:  Conjunctivae are normal.  EOMI. PERRLA. No scleral icterus. No raccoon eyes. Head: Atraumatic. No Battle sign. Nose: No congestion/rhinnorhea. No swelling over the nose, or septal hematoma. Mouth/Throat: Mucous membranes are moist.  No dental injury or malocclusion. Neck: No stridor.  Supple.  No midline C-spine tenderness to palpation, step-offs or deformities. Full range of motion without pain. Cardiovascular: Normal rate, regular rhythm. No murmurs, rubs or gallops.  Respiratory: Normal respiratory effort.  No accessory muscle use or retractions. Lungs CTAB.  No wheezes, rales or ronchi. Gastrointestinal: Soft, nontender and nondistended.  No guarding or rebound.  No peritoneal signs. Musculoskeletal: No LE edema. No ttp in the calves or palpable cords.  Negative Homan's sign. Full range of motion of the bilateral wrists, elbows, shoulders, ankles, knees, and hips without obvious pain. Normal DP and PT pulses in the bilateral feet. Neurologic:  Alert.    Face and smile are symmetric.  EOMI. PERRLA. Moves all extremities well. Skin:  Skin is warm, dry and intact. No rash noted. Psychiatric: Mood and affect are normal. Speech and behavior are normal.  Normal judgement.  ____________________________________________   LABS (all labs ordered are listed, but only abnormal results are displayed)  Labs Reviewed  BASIC METABOLIC PANEL - Abnormal; Notable for the following:       Result Value   Potassium 3.4 (*)    Chloride 98 (*)    Glucose, Bld 213 (*)    BUN 32 (*)    All other components within normal limits  URINALYSIS, COMPLETE (UACMP) WITH MICROSCOPIC - Abnormal; Notable for the following:    Color, Urine YELLOW (*)    APPearance CLEAR (*)    Glucose, UA 150 (*)    Ketones, ur 5 (*)    All other components within normal limits  CBC  HEPATIC FUNCTION PANEL  TROPONIN I  BRAIN NATRIURETIC PEPTIDE   ____________________________________________  EKG  ED ECG REPORT I, Eula Listen, the attending physician, personally viewed and interpreted this ECG.   Date: 09/16/2016  EKG Time: 1440  Rate: 116  Rhythm: sinus tachycardia  Axis: leftward  Intervals:none  ST&T Change: No STEMI  ____________________________________________  RADIOLOGY  Ct Angio Head W Or Wo Contrast  Result Date: 09/16/2016 CLINICAL DATA:  Dizziness.  Fall.  Unsteady gait. EXAM: CT ANGIOGRAPHY HEAD AND NECK TECHNIQUE: Multidetector CT imaging of the head and neck was performed using the standard protocol during bolus administration of intravenous contrast. Multiplanar CT image reconstructions and MIPs were obtained to evaluate the vascular anatomy. Carotid stenosis measurements (when applicable) are obtained utilizing NASCET criteria, using the distal internal carotid diameter as the denominator. CONTRAST:  75 mL Isovue 370 IV COMPARISON:  CT head 05/04/2011 FINDINGS: CT HEAD FINDINGS Brain: Moderate atrophy. Mild chronic microvascular ischemic change in the white matter. Negative for acute infarct. Negative for hemorrhage or mass. Vascular: No hyperdense vessel or  unexpected calcification. Skull: Negative for fracture. Sinuses: Mild mucosal edema in the paranasal sinuses. Orbits: Bilateral ocular surgery.  Negative for mass in the orbit. Review of the MIP images confirms the above findings CTA NECK FINDINGS Aortic arch: Mild atherosclerotic calcification in the aortic arch. Proximal great vessels patent. Right carotid system: Right carotid bifurcation widely patent without stenosis or dissection Left carotid system: Mild atherosclerotic plaque at the left carotid bifurcation without significant stenosis or dissection. Vertebral arteries: Calcific plaque at the origin of the left vertebral artery causing moderate stenosis. Left vertebral artery is patent to the basilar. Right vertebral artery widely patent throughout its course. 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 Multilevel  lumbar disc degeneration and spurring. No acute skeletal abnormality. Other neck: Negative for mass or adenopathy in the neck. Upper chest: Lung apices clear. Review of the MIP images confirms the above findings CTA HEAD FINDINGS Anterior circulation: Mild atherosclerotic calcification in the cavernous carotid on the right. No significant carotid stenosis. Anterior and middle cerebral arteries widely patent Posterior circulation: Both vertebral arteries patent to the basilar. Left PICA patent. Right PICA not visualized. AICA, superior cerebellar, and posterior cerebral arteries patent bilaterally. Basilar normal. Venous sinuses: Patent Anatomic variants: Negative for cerebral aneurysm Delayed phase: Normal enhancement on delayed imaging. Review of the MIP images confirms the above findings IMPRESSION: Atrophy and chronic microvascular ischemia. No acute intracranial abnormality. No significant carotid stenosis. Moderate stenosis at the origin of left vertebral artery due to calcific plaque. Electronically Signed   By: Franchot Gallo M.D.   On: 09/16/2016 17:14   Ct Angio Neck W And/or Wo Contrast  Result Date: 09/16/2016 CLINICAL DATA:  Dizziness.  Fall.  Unsteady gait. EXAM: CT ANGIOGRAPHY HEAD AND NECK TECHNIQUE: Multidetector CT imaging of the head and neck was performed using the standard protocol during bolus administration of intravenous contrast. Multiplanar CT image reconstructions and MIPs were obtained to evaluate the vascular anatomy. Carotid stenosis measurements (when applicable) are obtained utilizing NASCET criteria, using the distal internal carotid diameter as the denominator. CONTRAST:  75 mL Isovue 370 IV COMPARISON:  CT head 05/04/2011 FINDINGS: CT HEAD FINDINGS Brain: Moderate atrophy. Mild chronic microvascular ischemic change in the white matter. Negative for acute infarct. Negative for hemorrhage or mass. Vascular: No hyperdense vessel or unexpected calcification. Skull: Negative for  fracture. Sinuses: Mild mucosal edema in the paranasal sinuses. Orbits: Bilateral ocular surgery.  Negative for mass in the orbit. Review of the MIP images confirms the above findings CTA NECK FINDINGS Aortic arch: Mild atherosclerotic calcification in the aortic arch. Proximal great vessels patent. Right carotid system: Right carotid bifurcation widely patent without stenosis or dissection Left carotid system: Mild atherosclerotic plaque at the left carotid bifurcation without significant stenosis or dissection. Vertebral arteries: Calcific plaque at the origin of the left vertebral artery causing moderate stenosis. Left vertebral artery is patent to the basilar. Right vertebral artery widely patent throughout its course. 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 Multilevel lumbar disc degeneration and spurring. No acute skeletal abnormality. Other neck: Negative for mass or adenopathy in the neck. Upper chest: Lung apices clear. Review of the MIP images confirms the above findings CTA HEAD FINDINGS Anterior circulation: Mild atherosclerotic calcification in the cavernous carotid on the right. No significant carotid stenosis. Anterior and middle cerebral arteries widely patent Posterior circulation: Both vertebral arteries patent to the basilar. Left PICA patent. Right PICA not visualized. AICA, superior cerebellar, and posterior cerebral arteries patent bilaterally. Basilar  normal. Venous sinuses: Patent Anatomic variants: Negative for cerebral aneurysm Delayed phase: Normal enhancement on delayed imaging. Review of the MIP images confirms the above findings IMPRESSION: Atrophy and chronic microvascular ischemia. No acute intracranial abnormality. No significant carotid stenosis. Moderate stenosis at the origin of left vertebral artery due to calcific plaque. Electronically Signed   By: Franchot Gallo M.D.   On: 09/16/2016 17:14   Dg Hips Bilat W Or Wo Pelvis 3-4 Views  Result Date:  09/16/2016 CLINICAL DATA:  Dizziness and fall. Per [facility], patient comes from home after a fall she had yesterday. Patient hit her head. Family called EMS yesterday then refused transport. Today, patient woke up with unsteady gait, "more than normal". EXAM: DG HIP (WITH OR WITHOUT PELVIS) 3-4V BILAT COMPARISON:  None. FINDINGS: No evidence for acute fracture or subluxation. Left hip hemiarthroplasty is well seated. Contrast is identified the distal ureters and urinary bladder following contrast laceration. Coarse calcification in the central pelvis is consistent with a fibroid. Bones appear osteopenic. IMPRESSION: No evidence for acute  abnormality. Electronically Signed   By: Nolon Nations M.D.   On: 09/16/2016 17:10    ____________________________________________   PROCEDURES  Procedure(s) performed: None  Procedures  Critical Care performed: No ____________________________________________   INITIAL IMPRESSION / ASSESSMENT AND PLAN / ED COURSE  Pertinent labs & imaging results that were available during my care of the patient were reviewed by me and considered in my medical decision making (see chart for details).  83 y.o. female with a history of mental retardation presenting with 2 falls, now inability to walk. On arrival to the emergency patient does have some sinus tachycardia and a history of decreased by mouth intake over the last week. This car he may be due to dehydration, and she does have an elevation of her BN although her creatinine is normal and the specific gravity in her urine is reassuring. I did do a CT of the head and CT angiogram of the head and neck without any acute findings although she has some stenosis at the base of the left vertebral artery. At this time, I have admitted the patient for further evaluation and treatment.  ----------------------------------------- 6:02 PM on 09/16/2016 -----------------------------------------  At this time, the patient is  resting comfortably but she discontinued to be tachycardic. She has received 1 L fluid to which has improved her heart rate but we will give her another liter.  ____________________________________________  FINAL CLINICAL IMPRESSION(S) / ED DIAGNOSES  Final diagnoses:  Fall, initial encounter  Inability to walk  Altered mental status, unspecified altered mental status type  Anorexia  Sinus tachycardia  Dehydration         NEW MEDICATIONS STARTED DURING THIS VISIT:  New Prescriptions   No medications on file      Eula Listen, MD 09/16/16 1756    Eula Listen, MD 09/16/16 [id number]

## 2016-09-16 NOTE — ED Notes (Signed)
Attempted to call report. Staff states, "We are in our protected time. Call back at 1920".

## 2016-09-17 LAB — BASIC METABOLIC PANEL
ANION GAP: 7 (ref 5–15)
BUN: 15 mg/dL (ref 6–20)
CHLORIDE: 107 mmol/L (ref 101–111)
CO2: 27 mmol/L (ref 22–32)
Calcium: 8.4 mg/dL — ABNORMAL LOW (ref 8.9–10.3)
Creatinine, Ser: 0.53 mg/dL (ref 0.44–1.00)
GFR calc Af Amer: >60 mL/min (ref >60)
GFR calc non Af Amer: >60 mL/min (ref >60)
Glucose, Bld: 142 mg/dL — ABNORMAL HIGH (ref 65–99)
POTASSIUM: 3.2 mmol/L — AB (ref 3.5–5.1)
Sodium: 141 mmol/L (ref 135–145)

## 2016-09-17 LAB — GLUCOSE, CAPILLARY: Glucose-Capillary: 145 mg/dL — ABNORMAL HIGH (ref 65–99)

## 2016-09-17 LAB — CBC
HEMATOCRIT: 33.1 % — AB (ref 35.0–47.0)
HEMOGLOBIN: 11.3 g/dL — AB (ref 12.0–16.0)
MCH: 29.5 pg (ref 26.0–34.0)
MCHC: 34.3 g/dL (ref 32.0–36.0)
MCV: 86.2 fL (ref 80.0–100.0)
Platelets: 273 10*3/uL (ref 150–440)
RBC: 3.84 MIL/uL (ref 3.80–5.20)
RDW: 14.2 % (ref 11.5–14.5)
WBC: 4.8 10*3/uL (ref 3.6–11.0)

## 2016-09-17 NOTE — Evaluation (Deleted)
Physical Therapy Evaluation Patient Details Name: Jennifer Villarreal MRN: [id number] DOB: 12/05/1930 Today's Date: 09/17/2016   History of Present Illness  83 yo female with severe MR has been admitted with falls and unsteady gait with dizziness, PMHx:  osteopenia, L hemiarthroplasty, MR, DM, HLD, GERD, PE, HTN  Clinical Impression  Pt is up to walk with assistance, and had pulse 108 at rest, 118 after gait with no distress.  However, she did sit quickly at chair nearby, and then back to bed with encouragement to keep going.  Her plan is to follow acutely for strengthening and gait as tolerated and will recommend inpt care as she is going to need continual assistance for all gait until strength is recovered.    Follow Up Recommendations SNF    Equipment Recommendations  None recommended by PT    Recommendations for Other Services       Precautions / Restrictions Precautions Precautions: Fall Restrictions Weight Bearing Restrictions: No      Mobility  Bed Mobility Overal bed mobility: Needs Assistance Bed Mobility: Supine to Sit;Sit to Supine     Supine to sit: Min assist Sit to supine: Mod assist   General bed mobility comments: pt was apparently tired and had PT lift her legs onto bed  Transfers Overall transfer level: Needs assistance Equipment used: Rolling walker (2 wheeled);1 person hand held assist Transfers: Sit to/from [facility] Sit to Stand: Min assist Stand pivot transfers: Min assist       General transfer comment: assisted to power up and  to direct her efforts to turn  Ambulation/Gait Ambulation/Gait assistance: Min assist Ambulation Distance (Feet): 15 Feet (7.5 x2) Assistive device: 1 person hand held assist;Rolling walker (2 wheeled) Gait Pattern/deviations: Step-to pattern;Step-through pattern;Trunk flexed;Narrow base of support;Shuffle;Decreased stride length Gait velocity: reduced Gait velocity interpretation: Below normal speed for  age/gender General Gait Details: Pt is weak and could not assist with details about PLOF  Stairs            Wheelchair Mobility    Modified Rankin (Stroke Patients Only)       Balance Overall balance assessment: Needs assistance Sitting-balance support: Feet supported Sitting balance-Leahy Scale: Fair     Standing balance support: Bilateral upper extremity supported Standing balance-Leahy Scale: Poor                               Pertinent Vitals/Pain Pain Assessment: No/denies pain    Home Living Family/patient expects to be discharged to:: Private residence Living Arrangements: Other relatives (Sister) Available Help at Discharge: Family;Available 24 hours/day Type of Home: House Home Access:  (pt could not state)         Additional Comments: Pt cannot give history    Prior Function Level of Independence:  (not sure)               Hand Dominance        Extremity/Trunk Assessment   Upper Extremity Assessment Upper Extremity Assessment: Generalized weakness    Lower Extremity Assessment Lower Extremity Assessment: Generalized weakness    Cervical / Trunk Assessment Cervical / Trunk Assessment: Normal  Communication   Communication: Expressive difficulties (very few words)  Cognition Arousal/Alertness: Awake/alert Behavior During Therapy: Flat affect;Impulsive Overall Cognitive Status: History of cognitive impairments - at baseline                      General Comments  Exercises     Assessment/Plan    PT Assessment Patient needs continued PT services  PT Problem List Decreased strength;Decreased range of motion;Decreased activity tolerance;Decreased balance;Decreased mobility;Decreased coordination;Decreased cognition;Decreased knowledge of use of DME;Decreased safety awareness;Decreased knowledge of precautions;Cardiopulmonary status limiting activity          PT Treatment Interventions DME  instruction;Gait training;Functional mobility training;Therapeutic activities;Therapeutic exercise;Balance training;Neuromuscular re-education;Patient/family education    PT Goals (Current goals can be found in the Care Plan section)  Acute Rehab PT Goals Patient Stated Goal: none stated PT Goal Formulation: Patient unable to participate in goal setting Time For Goal Achievement: 10/01/16 Potential to Achieve Goals: Fair    Frequency Min 2X/week   Barriers to discharge Decreased caregiver support has only one sister who may not be able to assist at all times    Co-evaluation               End of Session Equipment Utilized During Treatment: Gait belt Activity Tolerance: Patient limited by fatigue Patient left: in bed;with call bell/phone within reach;with bed alarm set Nurse Communication: Mobility status         Time: [id number] PT Time Calculation (min) (ACUTE ONLY): 24 min   Charges:   PT Evaluation $PT Eval Moderate Complexity: 1 Procedure PT Treatments $Gait Training: 8-22 mins   PT G Codes:        Ramond Dial 10/05/2016, 1:18 PM  Mee Hives, PT MS Acute Rehab Dept. Number: [place]

## 2016-09-17 NOTE — Progress Notes (Signed)
[facility] [place] NAME: Jennifer Villarreal    MR#:  [id number]  DATE OF BIRTH:  07-Jan-1931  SUBJECTIVE:admitted yesterday because of generalized weakness, multiple falls.  CHIEF COMPLAINT:   Chief Complaint  Patient presents with  . Weakness    REVIEW OF SYSTEMS:    Review of Systems  Unable to perform ROS: Mental acuity   Has severe mental retardation.  Review of systems because of mental status Nutrition:  Tolerating Diet: Tolerating PT:      DRUG ALLERGIES:  No Known Allergies  VITALS:  Blood pressure 110/67, pulse (!) 103, temperature 99.3 F (37.4 C), temperature source Oral, resp. rate 20, height 5\' 1"  (1.549 m), weight 51.7 kg (114 lb), SpO2 96 %.  PHYSICAL EXAMINATION:   Physical Exam  GENERAL:  83 y.o.-year-old patient lying in the bed with no acute distress.  EYES: Pupils equal, round, reactive to light and accommodation. No scleral icterus. Extraocular muscles intact.  HEENT: Head atraumatic, normocephalic. Oropharynx and nasopharynx clear.  NECK:  Supple, no jugular venous distention. No thyroid enlargement, no tenderness.  LUNGS: Normal breath sounds bilaterally, no wheezing, rales,rhonchi or crepitation. No use of accessory muscles of respiration.  CARDIOVASCULAR: S1, S2 normal. No murmurs, rubs, or gallops.  ABDOMEN: Soft, nontender, nondistended. Bowel sounds present. No organomegaly or mass.  EXTREMITIES: No pedal edema, cyanosis, or clubbing.  NEUROLOGIC: can not do neuro exam as she does not follow commands at baseline(Has IQ of 83 yr old)  PSYCHIATRIC: The patient is alert but  Does not talk and communicate much at baseline. SKIN: No obvious rash, lesion, or ulcer.    LABORATORY PANEL:   CBC  Recent Labs Lab 09/17/16 0432  WBC 4.8  HGB 11.3*  HCT 33.1*  PLT 273    ------------------------------------------------------------------------------------------------------------------  Chemistries   Recent Labs Lab 09/16/16 1440 09/17/16 0432  NA 137 141  K 3.4* 3.2*  CL 98* 107  CO2 28 27  GLUCOSE 213* 142*  BUN 32* 15  CREATININE 0.49 0.53  CALCIUM 9.5 8.4*  AST 28  --   ALT 21  --   ALKPHOS 45  --   BILITOT 0.9  --    ------------------------------------------------------------------------------------------------------------------  Cardiac Enzymes No results for input(s): TROPONINI in the last 168 hours. ------------------------------------------------------------------------------------------------------------------  RADIOLOGY:  Ct Angio Head W Or Wo Contrast  Result Date: 09/16/2016 CLINICAL DATA:  Dizziness.  Fall.  Unsteady gait. EXAM: CT ANGIOGRAPHY HEAD AND NECK TECHNIQUE: Multidetector CT imaging of the head and neck was performed using the standard protocol during bolus administration of intravenous contrast. Multiplanar CT image reconstructions and MIPs were obtained to evaluate the vascular anatomy. Carotid stenosis measurements (when applicable) are obtained utilizing NASCET criteria, using the distal internal carotid diameter as the denominator. CONTRAST:  75 mL Isovue 370 IV COMPARISON:  CT head 05/04/2011 FINDINGS: CT HEAD FINDINGS Brain: Moderate atrophy. Mild chronic microvascular ischemic change in the white matter. Negative for acute infarct. Negative for hemorrhage or mass. Vascular: No hyperdense vessel or unexpected calcification. Skull: Negative for fracture. Sinuses: Mild mucosal edema in the paranasal sinuses. Orbits: Bilateral ocular surgery.  Negative for mass in the orbit. Review of the MIP images confirms the above findings CTA NECK FINDINGS Aortic arch: Mild atherosclerotic calcification in the aortic arch. Proximal great vessels patent. Right carotid system: Right carotid bifurcation widely patent without stenosis or  dissection Left carotid system: Mild atherosclerotic plaque at the left carotid bifurcation without significant stenosis or dissection.  Vertebral arteries: Calcific plaque at the origin of the left vertebral artery causing moderate stenosis. Left vertebral artery is patent to the basilar. Right vertebral artery widely patent throughout its course. 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 Multilevel lumbar disc degeneration and spurring. No acute skeletal abnormality. Other neck: Negative for mass or adenopathy in the neck. Upper chest: Lung apices clear. Review of the MIP images confirms the above findings CTA HEAD FINDINGS Anterior circulation: Mild atherosclerotic calcification in the cavernous carotid on the right. No significant carotid stenosis. Anterior and middle cerebral arteries widely patent Posterior circulation: Both vertebral arteries patent to the basilar. Left PICA patent. Right PICA not visualized. AICA, superior cerebellar, and posterior cerebral arteries patent bilaterally. Basilar normal. Venous sinuses: Patent Anatomic variants: Negative for cerebral aneurysm Delayed phase: Normal enhancement on delayed imaging. Review of the MIP images confirms the above findings IMPRESSION: Atrophy and chronic microvascular ischemia. No acute intracranial abnormality. No significant carotid stenosis. Moderate stenosis at the origin of left vertebral artery due to calcific plaque. Electronically Signed   By: Franchot Gallo M.D.   On: 09/16/2016 17:14   Dg Chest 2 View  Result Date: 09/16/2016 CLINICAL DATA:  Falls.  Unsteady gait.  Initial encounter. EXAM: CHEST  2 VIEW COMPARISON:  07/25/2012 FINDINGS: Normal heart size. Mitral annular ossification. Stable mild aortic tortuosity. Aortic atherosclerosis. There is no edema, consolidation, effusion, or pneumothorax. Osteopenia. No acute osseous finding. IMPRESSION: No active cardiopulmonary disease. Electronically Signed   By: Monte Fantasia M.D.   On: 09/16/2016 18:27   Ct Angio Neck W And/or Wo Contrast  Result Date: 09/16/2016 CLINICAL DATA:  Dizziness.  Fall.  Unsteady gait. EXAM: CT ANGIOGRAPHY HEAD AND NECK TECHNIQUE: Multidetector CT imaging of the head and neck was performed using the standard protocol during bolus administration of intravenous contrast. Multiplanar CT image reconstructions and MIPs were obtained to evaluate the vascular anatomy. Carotid stenosis measurements (when applicable) are obtained utilizing NASCET criteria, using the distal internal carotid diameter as the denominator. CONTRAST:  75 mL Isovue 370 IV COMPARISON:  CT head 05/04/2011 FINDINGS: CT HEAD FINDINGS Brain: Moderate atrophy. Mild chronic microvascular ischemic change in the white matter. Negative for acute infarct. Negative for hemorrhage or mass. Vascular: No hyperdense vessel or unexpected calcification. Skull: Negative for fracture. Sinuses: Mild mucosal edema in the paranasal sinuses. Orbits: Bilateral ocular surgery.  Negative for mass in the orbit. Review of the MIP images confirms the above findings CTA NECK FINDINGS Aortic arch: Mild atherosclerotic calcification in the aortic arch. Proximal great vessels patent. Right carotid system: Right carotid bifurcation widely patent without stenosis or dissection Left carotid system: Mild atherosclerotic plaque at the left carotid bifurcation without significant stenosis or dissection. Vertebral arteries: Calcific plaque at the origin of the left vertebral artery causing moderate stenosis. Left vertebral artery is patent to the basilar. Right vertebral artery widely patent throughout its course. 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 Multilevel lumbar disc degeneration and spurring. No acute skeletal abnormality. Other neck: Negative for mass or adenopathy in the neck. Upper chest: Lung apices clear. Review of the MIP images confirms the above findings CTA HEAD FINDINGS Anterior  circulation: Mild atherosclerotic calcification in the cavernous carotid on the right. No significant carotid stenosis. Anterior and middle cerebral arteries widely patent Posterior circulation: Both vertebral arteries patent to the basilar. Left PICA patent. Right PICA not visualized. AICA, superior cerebellar, and posterior cerebral arteries patent bilaterally. Basilar normal. Venous sinuses: Patent Anatomic  variants: Negative for cerebral aneurysm Delayed phase: Normal enhancement on delayed imaging. Review of the MIP images confirms the above findings IMPRESSION: Atrophy and chronic microvascular ischemia. No acute intracranial abnormality. No significant carotid stenosis. Moderate stenosis at the origin of left vertebral artery due to calcific plaque. Electronically Signed   By: Franchot Gallo M.D.   On: 09/16/2016 17:14   Dg Hips Bilat W Or Wo Pelvis 3-4 Views  Result Date: 09/16/2016 CLINICAL DATA:  Dizziness and fall. Per [facility], patient comes from home after a fall she had yesterday. Patient hit her head. Family called EMS yesterday then refused transport. Today, patient woke up with unsteady gait, "more than normal". EXAM: DG HIP (WITH OR WITHOUT PELVIS) 3-4V BILAT COMPARISON:  None. FINDINGS: No evidence for acute fracture or subluxation. Left hip hemiarthroplasty is well seated. Contrast is identified the distal ureters and urinary bladder following contrast laceration. Coarse calcification in the central pelvis is consistent with a fibroid. Bones appear osteopenic. IMPRESSION: No evidence for acute  abnormality. Electronically Signed   By: Nolon Nations M.D.   On: 09/16/2016 17:10     ASSESSMENT AND PLAN:   Active Problems:   Ambulatory dysfunction   83 year old female patient with severe mental retardation brought in by family because of multiple falls, ambulated dysfunction: Physical therapy recommended SNIF placement. #2 hypokalemia replace the potassium by mouth #3 tachycardia on  admission: Received IV hydration. #4 essential hypertension: Continue present medications, metoprolol dose decreased today because of follow-up soft blood  pressure but according to family metoprolol dose has been increased recently to 100 mg secondary to PVD/  5.DMII;continue present meds;.`resume lantus at discharge, Code;partial D/w sister yesterday    All the records are reviewed and case discussed with Care Management/Social Workerr. Management plans discussed with the patient, family and they are in agreement.  CODE STATUS: partial(NO intubation but wants CPR)  TOTAL TIME TAKING CARE OF THIS PATIENT:35 minutes.   POSSIBLE D/C IN 1-2DAYS, DEPENDING ON CLINICAL CONDITION.   Epifanio Lesches M.D on 09/17/2016 at 1:22 PM  Between 7am to 6pm - Pager - [phone]  After 6pm go to [email or web address] - password [email or web address] [facility]  Office  [phone]  CC: Primary care physician; Vista Mink, FNP

## 2016-09-17 NOTE — Progress Notes (Signed)
MD notified of BP of 110/67. Verbal orders given to give 50mg  of Metoprolol instead of 100mg . Ammie Dalton, RN

## 2016-09-17 NOTE — Clinical Social Work Note (Addendum)
Clinical Social Work Assessment  Patient Details  Name: Jennifer Villarreal MRN: [id number] Date of Birth: [birth year]  Date of referral:  09/17/16               Reason for consult:  Facility Placement                Permission sought to share information with:  Facility Art therapist granted to share information::  Yes, Verbal Permission Granted  Name::        Agency::     Relationship::     Contact Information:     Housing/Transportation Living arrangements for the past 2 months:  Single Family Home Source of Information:  Other (Comment Required), Power of Attorney (Patient's sister, HCPOA/Guardian) Patient Interpreter Needed:  None Criminal Activity/Legal Involvement Pertinent to Current Situation/Hospitalization:  No - Comment as needed Significant Relationships:  Siblings Lives with:  Siblings Do you feel safe going back to the place where you live?  Yes Need for family participation in patient care:  Yes (Comment)  Care giving concerns:  PT recommendation for STR   Social Worker assessment / plan:  CSW received consult for placement. CSW contacted the patient's legal guardian/sister/HCPOA Tori Milks to discuss Medicare guidelines for patients under observation. Margie verbalized understanding that Medicare would not pay for STR at a SNF due to observation status. Margie understood that the patient is not at [facility] SNF level at this time. CSW offered to refer for ALF, which Margie agreed to. Patient's PASSR is [id number]. Further, the patient's sister has Parkinson's, which she reported is advancing to the level that she can no longer care for the patient at home.  Lesleigh Noe indicated that the patient does have Medicaid. The hope is that the patient will be placed in an ALF by early next week and can have HHPT at the facility rather than the family pay out of pocket for STR at a SNF.  Employment status:  Retired Forensic scientist:    PT Recommendations:   [place] / Referral to community resources:  Other (Comment Required) ([place])  Patient/Family's Response to care: The patient's sister thanked the CSW for assistance and information.  Patient/Family's Understanding of and Emotional Response to Diagnosis, Current Treatment, and Prognosis:  The patient's sister understands the barriers to discharge and is willing to adapt.  Emotional Assessment Appearance:  Appears stated age Attitude/Demeanor/Rapport:   (Pleasant) Affect (typically observed):  Accepting Orientation:  Oriented to Self Alcohol / Substance use:  Never Used Psych involvement (Current and /or in the community):  No (Comment)  Discharge Needs  Concerns to be addressed:  Care Coordination, Cognitive Concerns, Discharge Planning Concerns, Financial / Insurance Concerns Readmission within the last 30 days:  No Current discharge risk:  Cognitively Impaired Barriers to Discharge:  Continued Medical Work up, Unsafe home situation   Zettie Pho, LCSW 09/17/2016, 4:32 PM

## 2016-09-17 NOTE — Care Management Obs Status (Signed)
[facility] NOTIFICATION   Patient Details  Name: Jennifer Villarreal MRN: [id number] Date of Birth: 16-Feb-1931   Medicare Observation Status Notification Given:  Yes    Sariyah Corcino A, RN 09/17/2016, 4:15 PM

## 2016-09-17 NOTE — Clinical Social Work Note (Addendum)
CSW received consult for possible SNF. CSW is following pending PT recommendations. Patient is Obs status.  Jennifer Villarreal, MSW, Latanya Presser [phone]

## 2016-09-17 NOTE — NC FL2 (Signed)
[place] LEVEL OF CARE SCREENING TOOL     IDENTIFICATION  Patient Name: Jennifer Villarreal Birthdate: February 11, 1931 Sex: female Admission Date (Current Location): 09/16/2016  [place] and [place] Number:  Engineering geologist and Address:  [facility], [address] [place], [place] [address]      Provider Number: [id number]  Attending Physician Name and Address:  Epifanio Lesches, MD  Relative Name and Phone Number:       Current Level of Care: Hospital Recommended Level of Care: [place] Prior Approval Number:    Date Approved/Denied:   PASRR Number:   [id number]   Discharge Plan: Other (Comment) (ALF)    Current Diagnoses: Patient Active Problem List   Diagnosis Date Noted  . Ambulatory dysfunction 09/16/2016  . Squamous cell carcinoma of right lower leg 07/24/2015  . Diabetes ([place]) 07/22/2015  . Hyperlipemia 07/22/2015  . HTN (hypertension) 07/22/2015  . GERD (gastroesophageal reflux disease) 07/22/2015  . Mental retardation 07/22/2015  . Acute pulmonary embolism ([place]) 10/27/2011    Orientation RESPIRATION BLADDER Height & Weight     Self, Time, Situation  Normal Continent Weight: 114 lb (51.7 kg) Height:  5\' 1"  (154.9 cm)  BEHAVIORAL SYMPTOMS/MOOD NEUROLOGICAL BOWEL NUTRITION STATUS      Continent    AMBULATORY STATUS COMMUNICATION OF NEEDS Skin   Extensive Assist Verbally Normal                       Personal Care Assistance Level of Assistance  Bathing, Feeding, Dressing Bathing Assistance: Limited assistance Feeding assistance: Independent Dressing Assistance: Limited assistance     Functional Limitations Info             SPECIAL CARE FACTORS FREQUENCY                       Contractures Contractures Info: Present    Additional Factors Info  Code Status Code Status Info: Partial: DNI, full otherwise             Current Medications (09/17/2016):  This is the current  hospital active medication list Current Facility-Administered Medications  Medication Dose Route Frequency Provider Last Rate Last Dose  . acetaminophen (TYLENOL) tablet 650 mg  650 mg Oral Q6H PRN Epifanio Lesches, MD       Or  . acetaminophen (TYLENOL) suppository 650 mg  650 mg Rectal Q6H PRN Epifanio Lesches, MD      . atorvastatin (LIPITOR) tablet 20 mg  20 mg Oral QHS Epifanio Lesches, MD      . docusate sodium (COLACE) capsule 100 mg  100 mg Oral BID Epifanio Lesches, MD   100 mg at 09/17/16 0946  . glimepiride (AMARYL) tablet 4 mg  4 mg Oral BID Epifanio Lesches, MD   4 mg at 09/17/16 0946  . heparin injection 5,000 Units  5,000 Units Subcutaneous Q8H Epifanio Lesches, MD   5,000 Units at 09/17/16 1405  . irbesartan (AVAPRO) tablet 37.5 mg  37.5 mg Oral Daily Epifanio Lesches, MD   37.5 mg at 09/17/16 0946  . LORazepam (ATIVAN) tablet 0.5 mg  0.5 mg Oral QHS Epifanio Lesches, MD   0.5 mg at 09/16/16 2259  . metFORMIN (GLUCOPHAGE) tablet 1,000 mg  1,000 mg Oral BID WC Epifanio Lesches, MD   1,000 mg at 09/17/16 0726  . metoprolol succinate (TOPROL-XL) 24 hr tablet 100 mg  100 mg Oral Daily Epifanio Lesches, MD   50 mg  at 09/17/16 1010  . ondansetron (ZOFRAN) tablet 4 mg  4 mg Oral Q6H PRN Epifanio Lesches, MD       Or  . ondansetron (ZOFRAN) injection 4 mg  4 mg Intravenous Q6H PRN Epifanio Lesches, MD      . pantoprazole (PROTONIX) EC tablet 40 mg  40 mg Oral Daily Epifanio Lesches, MD   40 mg at 09/17/16 0946  . potassium chloride SA (K-DUR,KLOR-CON) CR tablet 10 mEq  10 mEq Oral Daily Epifanio Lesches, MD   10 mEq at 09/17/16 [id number]     Discharge Medications: Please see discharge summary for a list of discharge medications.  Relevant Imaging Results:  Relevant Lab Results:   Additional Information SS# [id number]  Zettie Pho, LCSW

## 2016-09-18 LAB — GLUCOSE, CAPILLARY: Glucose-Capillary: 152 mg/dL — ABNORMAL HIGH (ref 65–99)

## 2016-09-18 NOTE — Progress Notes (Signed)
[place] at [place] NAME: Jennifer Villarreal    MR#:  [id number]  DATE OF BIRTH:  [birth year]  SUBJECTIVE:admitted   For because of generalized weakness, multiple falls.  CHIEF COMPLAINT:   Chief Complaint  Patient presents with  . Weakness    REVIEW OF SYSTEMS:    Review of Systems  Unable to perform ROS: Mental acuity   Has severe mental retardation.  Review of systems because of mental status Nutrition:  Tolerating Diet: Tolerating PT:      DRUG ALLERGIES:  No Known Allergies  VITALS:  Blood pressure (!) 156/92, pulse (!) 108, temperature 98.2 F (36.8 C), temperature source Oral, resp. rate 18, height 5\' 1"  (1.549 m), weight 49.5 kg (109 lb 1.6 oz), SpO2 94 %.  PHYSICAL EXAMINATION:   Physical Exam  GENERAL:  83 y.o.-year-old patient lying in the bed with no acute distress.  EYES: Pupils equal, round, reactive to light and accommodation. No scleral icterus. Extraocular muscles intact.  HEENT: Head atraumatic, normocephalic. Oropharynx and nasopharynx clear.  NECK:  Supple, no jugular venous distention. No thyroid enlargement, no tenderness.  LUNGS: Normal breath sounds bilaterally, no wheezing, rales,rhonchi or crepitation. No use of accessory muscles of respiration.  CARDIOVASCULAR: S1, S2 normal. No murmurs, rubs, or gallops.  ABDOMEN: Soft, nontender, nondistended. Bowel sounds present. No organomegaly or mass.  EXTREMITIES: No pedal edema, cyanosis, or clubbing.  NEUROLOGIC: can not do neuro exam as she does not follow commands at baseline(Has IQ of 83 yr old)  PSYCHIATRIC: The patient is alert but  Does not talk and communicate much at baseline. SKIN: No obvious rash, lesion, or ulcer.    LABORATORY PANEL:   CBC  Recent Labs Lab 09/17/16 0432  WBC 4.8  HGB 11.3*  HCT 33.1*  PLT 273    ------------------------------------------------------------------------------------------------------------------  Chemistries   Recent Labs Lab 09/16/16 1440 09/17/16 0432  NA 137 141  K 3.4* 3.2*  CL 98* 107  CO2 28 27  GLUCOSE 213* 142*  BUN 32* 15  CREATININE 0.49 0.53  CALCIUM 9.5 8.4*  AST 28  --   ALT 21  --   ALKPHOS 45  --   BILITOT 0.9  --    ------------------------------------------------------------------------------------------------------------------  Cardiac Enzymes No results for input(s): TROPONINI in the last 168 hours. ------------------------------------------------------------------------------------------------------------------  RADIOLOGY:  Ct Angio Head W Or Wo Contrast  Result Date: 09/16/2016 CLINICAL DATA:  Dizziness.  Fall.  Unsteady gait. EXAM: CT ANGIOGRAPHY HEAD AND NECK TECHNIQUE: Multidetector CT imaging of the head and neck was performed using the standard protocol during bolus administration of intravenous contrast. Multiplanar CT image reconstructions and MIPs were obtained to evaluate the vascular anatomy. Carotid stenosis measurements (when applicable) are obtained utilizing NASCET criteria, using the distal internal carotid diameter as the denominator. CONTRAST:  75 mL Isovue 370 IV COMPARISON:  CT head 05/04/2011 FINDINGS: CT HEAD FINDINGS Brain: Moderate atrophy. Mild chronic microvascular ischemic change in the white matter. Negative for acute infarct. Negative for hemorrhage or mass. Vascular: No hyperdense vessel or unexpected calcification. Skull: Negative for fracture. Sinuses: Mild mucosal edema in the paranasal sinuses. Orbits: Bilateral ocular surgery.  Negative for mass in the orbit. Review of the MIP images confirms the above findings CTA NECK FINDINGS Aortic arch: Mild atherosclerotic calcification in the aortic arch. Proximal great vessels patent. Right carotid system: Right carotid bifurcation widely patent without stenosis or  dissection Left carotid system: Mild atherosclerotic plaque at the left carotid bifurcation  without significant stenosis or dissection. Vertebral arteries: Calcific plaque at the origin of the left vertebral artery causing moderate stenosis. Left vertebral artery is patent to the basilar. Right vertebral artery widely patent throughout its course. 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 Multilevel lumbar disc degeneration and spurring. No acute skeletal abnormality. Other neck: Negative for mass or adenopathy in the neck. Upper chest: Lung apices clear. Review of the MIP images confirms the above findings CTA HEAD FINDINGS Anterior circulation: Mild atherosclerotic calcification in the cavernous carotid on the right. No significant carotid stenosis. Anterior and middle cerebral arteries widely patent Posterior circulation: Both vertebral arteries patent to the basilar. Left PICA patent. Right PICA not visualized. AICA, superior cerebellar, and posterior cerebral arteries patent bilaterally. Basilar normal. Venous sinuses: Patent Anatomic variants: Negative for cerebral aneurysm Delayed phase: Normal enhancement on delayed imaging. Review of the MIP images confirms the above findings IMPRESSION: Atrophy and chronic microvascular ischemia. No acute intracranial abnormality. No significant carotid stenosis. Moderate stenosis at the origin of left vertebral artery due to calcific plaque. Electronically Signed   By: Franchot Gallo M.D.   On: 09/16/2016 17:14   Dg Chest 2 View  Result Date: 09/16/2016 CLINICAL DATA:  Falls.  Unsteady gait.  Initial encounter. EXAM: CHEST  2 VIEW COMPARISON:  07/25/2012 FINDINGS: Normal heart size. Mitral annular ossification. Stable mild aortic tortuosity. Aortic atherosclerosis. There is no edema, consolidation, effusion, or pneumothorax. Osteopenia. No acute osseous finding. IMPRESSION: No active cardiopulmonary disease. Electronically Signed   By: Monte Fantasia M.D.   On: 09/16/2016 18:27   Ct Angio Neck W And/or Wo Contrast  Result Date: 09/16/2016 CLINICAL DATA:  Dizziness.  Fall.  Unsteady gait. EXAM: CT ANGIOGRAPHY HEAD AND NECK TECHNIQUE: Multidetector CT imaging of the head and neck was performed using the standard protocol during bolus administration of intravenous contrast. Multiplanar CT image reconstructions and MIPs were obtained to evaluate the vascular anatomy. Carotid stenosis measurements (when applicable) are obtained utilizing NASCET criteria, using the distal internal carotid diameter as the denominator. CONTRAST:  75 mL Isovue 370 IV COMPARISON:  CT head 05/04/2011 FINDINGS: CT HEAD FINDINGS Brain: Moderate atrophy. Mild chronic microvascular ischemic change in the white matter. Negative for acute infarct. Negative for hemorrhage or mass. Vascular: No hyperdense vessel or unexpected calcification. Skull: Negative for fracture. Sinuses: Mild mucosal edema in the paranasal sinuses. Orbits: Bilateral ocular surgery.  Negative for mass in the orbit. Review of the MIP images confirms the above findings CTA NECK FINDINGS Aortic arch: Mild atherosclerotic calcification in the aortic arch. Proximal great vessels patent. Right carotid system: Right carotid bifurcation widely patent without stenosis or dissection Left carotid system: Mild atherosclerotic plaque at the left carotid bifurcation without significant stenosis or dissection. Vertebral arteries: Calcific plaque at the origin of the left vertebral artery causing moderate stenosis. Left vertebral artery is patent to the basilar. Right vertebral artery widely patent throughout its course. 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 Multilevel lumbar disc degeneration and spurring. No acute skeletal abnormality. Other neck: Negative for mass or adenopathy in the neck. Upper chest: Lung apices clear. Review of the MIP images confirms the above findings CTA HEAD FINDINGS Anterior  circulation: Mild atherosclerotic calcification in the cavernous carotid on the right. No significant carotid stenosis. Anterior and middle cerebral arteries widely patent Posterior circulation: Both vertebral arteries patent to the basilar. Left PICA patent. Right PICA not visualized. AICA, superior cerebellar, and posterior cerebral arteries patent bilaterally. Basilar  normal. Venous sinuses: Patent Anatomic variants: Negative for cerebral aneurysm Delayed phase: Normal enhancement on delayed imaging. Review of the MIP images confirms the above findings IMPRESSION: Atrophy and chronic microvascular ischemia. No acute intracranial abnormality. No significant carotid stenosis. Moderate stenosis at the origin of left vertebral artery due to calcific plaque. Electronically Signed   By: Franchot Gallo M.D.   On: 09/16/2016 17:14   Dg Hips Bilat W Or Wo Pelvis 3-4 Views  Result Date: 09/16/2016 CLINICAL DATA:  Dizziness and fall. Per [facility], patient comes from home after a fall she had yesterday. Patient hit her head. Family called EMS yesterday then refused transport. Today, patient woke up with unsteady gait, "more than normal". EXAM: DG HIP (WITH OR WITHOUT PELVIS) 3-4V BILAT COMPARISON:  None. FINDINGS: No evidence for acute fracture or subluxation. Left hip hemiarthroplasty is well seated. Contrast is identified the distal ureters and urinary bladder following contrast laceration. Coarse calcification in the central pelvis is consistent with a fibroid. Bones appear osteopenic. IMPRESSION: No evidence for acute  abnormality. Electronically Signed   By: Nolon Nations M.D.   On: 09/16/2016 17:10     ASSESSMENT AND PLAN:   Active Problems:   Ambulatory dysfunction   83 year old female patient with severe mental retardation brought in by family because of multiple falls, ambulated dysfunction: Physical therapy recommended SNIF placement. #2 hypokalemia replaced.potassium by mouth #3 tachycardia on  admission: Received IV hydration. #4 essential hypertension: Continue present medications, metoprolol dose decreased today because of follow-up soft blood  pressure but according to family metoprolol dose has been increased recently to 100 mg secondary to PVD/  5.DMII;continue present meds;.`resume lantus at discharge, Code;partial      All the records are reviewed and case discussed with Care Management/Social Workerr. Management plans discussed with the patient, family and they are in agreement.  CODE STATUS: partial(NO intubation but wants CPR)  TOTAL TIME TAKING CARE OF THIS PATIENT:35 minutes.   POSSIBLE D/C IN 1-2DAYS, DEPENDING ON CLINICAL CONDITION.   Epifanio Lesches M.D on 09/18/2016 at 12:45 PM  Between 7am to 6pm - Pager - [phone]  After 6pm go to [email or web address] - password [email or web address] [facility]  Office  [phone]  CC: Primary care physician; Vista Mink, FNP

## 2016-09-18 NOTE — Progress Notes (Deleted)
   09/17/16 2259  PT Visit Information  Assistance Needed +1  History of Present Illness 83 yo female with severe MR has been admitted with falls and unsteady gait with dizziness, PMHx:  osteopenia, L hemiarthroplasty, MR, DM, HLD, GERD, PE, HTN  Subjective Data  Patient Stated Goal none stated  Precautions  Precautions Fall  Restrictions  Weight Bearing Restrictions No  Cognition  Arousal/Alertness Awake/alert  Behavior During Therapy Flat affect;Impulsive  Overall Cognitive Status History of cognitive impairments - at baseline  Bed Mobility  Overal bed mobility Needs Assistance  Bed Mobility Supine to Sit;Sit to Supine  Supine to sit Min assist  Sit to supine Mod assist  General bed mobility comments pt was apparently tired and had PT lift her legs onto bed  Transfers  Overall transfer level Needs assistance  Equipment used Rolling walker (2 wheeled);1 person hand held assist  Transfers Sit to/from [employer] Transfers  Sit to Stand Min assist  Stand pivot transfers Min assist  General transfer comment assisted to power up and  to direct her efforts to turn  Ambulation/Gait  Ambulation/Gait assistance Min assist  Ambulation Distance (Feet) 15 Feet  Assistive device 1 person hand held assist;Rolling walker (2 wheeled)  Gait Pattern/deviations Step-to pattern;Step-through pattern;Trunk flexed;Narrow base of support;Shuffle;Decreased stride length  General Gait Details Pt is weak and could not assist with details about PLOF  Gait velocity reduced  Balance  Overall balance assessment Needs assistance  Sitting-balance support Feet supported  Sitting balance-Leahy Scale Fair  Standing balance support Bilateral upper extremity supported  Standing balance-Leahy Scale Poor  Exercises  Exercises Other exercises (demonstrates less than fair strength but is likely cognitive)  PT - End of Session  Equipment Utilized During Treatment Gait belt  Activity Tolerance Patient  limited by fatigue  Patient left in bed;with call bell/phone within reach;with bed alarm set  Nurse Communication Mobility status  PT - Assessment/Plan  PT Frequency (ACUTE ONLY) Min 2X/week  Follow Up Recommendations SNF  PT equipment None recommended by PT  Acute Rehab PT Goals  PT Goal Formulation Patient unable to participate in goal setting  Time For Goal Achievement 10/01/16  Potential to Achieve Goals Fair  PT Time Calculation  PT Start Time (ACUTE ONLY) 0942  PT Stop Time (ACUTE ONLY) 1006  PT Time Calculation (min) (ACUTE ONLY) 24 min  PT G-Codes **NOT FOR INPATIENT CLASS**  Functional Assessment Tool Used clinical judgment  Functional Limitation Mobility: Walking and moving around  Mee Hives, PT MS Acute Rehab Dept. Number: [place] and [place]

## 2016-09-18 NOTE — Clinical Social Work Note (Signed)
CSW visited the patient, her sister, and her nephew at bedside to discuss ALF placement. The patient currently has Medicaid, and the patient's sister (also HCPOA) indicated that [facility] is their first choice. CSW has sent referral to [facility]. CSW will con't to follow.  Santiago Bumpers, MSW, [place] [phone]

## 2016-09-19 LAB — GLUCOSE, CAPILLARY: GLUCOSE-CAPILLARY: 147 mg/dL — AB (ref 65–99)

## 2016-09-19 MED ORDER — INSULIN GLARGINE 100 UNIT/ML ~~LOC~~ SOLN: 10.0000 [IU] | Freq: Every day | SUBCUTANEOUS | 11 refills | Status: AC

## 2016-09-19 MED ORDER — ENOXAPARIN SODIUM 40 MG/0.4ML ~~LOC~~ SOLN
40.0000 mg | SUBCUTANEOUS | Status: DC
  Administered 2016-09-19 – 2016-09-20 (×2): 40 mg via SUBCUTANEOUS
  Filled 2016-09-19 (×2): qty 0.4

## 2016-09-19 NOTE — Plan of Care (Signed)
Problem: Safety: Goal: Ability to remain free from injury will improve Outcome: Progressing Patient has not have any fall nor injury thus far. Continued to be a high fall risk, with fall precautions in place.

## 2016-09-19 NOTE — Progress Notes (Signed)
While rounding, [place] made initial visit to room 105. RN informed me that the Pt has severe mental retardation. CH provided presence and offered prayer.     09/19/16 1400  Clinical Encounter Type  Visited With Patient;Health care provider  Visit Type Initial;Spiritual support  Consult/Referral To Chaplain

## 2016-09-19 NOTE — Progress Notes (Signed)
   09/17/16 2259  PT Visit Information  Assistance Needed +1  History of Present Illness 83 yo female with severe MR has been admitted with falls and unsteady gait with dizziness, PMHx:  osteopenia, L hemiarthroplasty, MR, DM, HLD, GERD, PE, HTN  Subjective Data  Patient Stated Goal none stated  Precautions  Precautions Fall  Restrictions  Weight Bearing Restrictions No  Cognition  Arousal/Alertness Awake/alert  Behavior During Therapy Flat affect;Impulsive  Overall Cognitive Status History of cognitive impairments - at baseline  Bed Mobility  Overal bed mobility Needs Assistance  Bed Mobility Supine to Sit;Sit to Supine  Supine to sit Min assist  Sit to supine Mod assist  General bed mobility comments pt was apparently tired and had PT lift her legs onto bed  Transfers  Overall transfer level Needs assistance  Equipment used Rolling walker (2 wheeled);1 person hand held assist  Transfers Sit to/from [employer] Transfers  Sit to Stand Min assist  Stand pivot transfers Min assist  General transfer comment assisted to power up and  to direct her efforts to turn  Ambulation/Gait  Ambulation/Gait assistance Min assist  Ambulation Distance (Feet) 15 Feet  Assistive device 1 person hand held assist;Rolling walker (2 wheeled)  Gait Pattern/deviations Step-to pattern;Step-through pattern;Trunk flexed;Narrow base of support;Shuffle;Decreased stride length  General Gait Details Pt is weak and could not assist with details about PLOF  Gait velocity reduced  Balance  Overall balance assessment Needs assistance  Sitting-balance support Feet supported  Sitting balance-Leahy Scale Fair  Standing balance support Bilateral upper extremity supported  Standing balance-Leahy Scale Poor  Exercises  Exercises Other exercises (demonstrates less than fair strength but is likely cognitive)  PT - End of Session  Equipment Utilized During Treatment Gait belt  Activity Tolerance Patient  limited by fatigue  Patient left in bed;with call bell/phone within reach;with bed alarm set  Nurse Communication Mobility status  PT - Assessment/Plan  PT Frequency (ACUTE ONLY) Min 2X/week  Follow Up Recommendations SNF  PT equipment None recommended by PT  Acute Rehab PT Goals  PT Goal Formulation Patient unable to participate in goal setting  Time For Goal Achievement 10/01/16  Potential to Achieve Goals Fair  PT Time Calculation  PT Start Time (ACUTE ONLY) 0942  PT Stop Time (ACUTE ONLY) 1006  PT Time Calculation (min) (ACUTE ONLY) 24 min  PT G-Codes **NOT FOR INPATIENT CLASS**  Functional Assessment Tool Used clinical judgment  Functional Limitation Mobility: Walking and moving around  Mobility: Walking and Moving Around Current Status [id number]) CJ  Mobility: Walking and Moving Around Goal Status [id number]) CI  Mee Hives, PT MS Acute Rehab Dept. Number: [place] and [place]

## 2016-09-19 NOTE — Progress Notes (Signed)
[place] at [place] NAME: Jennifer Villarreal    MR#:  [id number]  DATE OF BIRTH:  1931/06/23  SUBJECTIVE:admitted   For because of generalized weakness, multiple falls.  CHIEF COMPLAINT:   Chief Complaint  Patient presents with  . Weakness    REVIEW OF SYSTEMS:    Review of Systems  Unable to perform ROS: Mental acuity   Has severe mental retardation.  Review of systems because of mental status Nutrition:  Tolerating Diet: Tolerating PT:      DRUG ALLERGIES:  No Known Allergies  VITALS:  Blood pressure (!) 147/70, pulse (!) 106, temperature 99.1 F (37.3 C), temperature source Oral, resp. rate 20, height 5\' 1"  (1.549 m), weight 49.3 kg (108 lb 9.6 oz), SpO2 98 %.  PHYSICAL EXAMINATION:   Physical Exam  GENERAL:  83 y.o.-year-old patient lying in the bed with no acute distress.  EYES: Pupils equal, round, reactive to light and accommodation. No scleral icterus. Extraocular muscles intact.  HEENT: Head atraumatic, normocephalic. Oropharynx and nasopharynx clear.  NECK:  Supple, no jugular venous distention. No thyroid enlargement, no tenderness.  LUNGS: Normal breath sounds bilaterally, no wheezing, rales,rhonchi or crepitation. No use of accessory muscles of respiration.  CARDIOVASCULAR: S1, S2 normal. No murmurs, rubs, or gallops.  ABDOMEN: Soft, nontender, nondistended. Bowel sounds present. No organomegaly or mass.  EXTREMITIES: No pedal edema, cyanosis, or clubbing.  NEUROLOGIC: can not do neuro exam as she does not follow commands at baseline(Has IQ of 83 yr old)  PSYCHIATRIC: The patient is alert but  Does not talk and communicate much at baseline. SKIN: No obvious rash, lesion, or ulcer.    LABORATORY PANEL:   CBC  Recent Labs Lab 09/17/16 0432  WBC 4.8  HGB 11.3*  HCT 33.1*  PLT 273    ------------------------------------------------------------------------------------------------------------------  Chemistries   Recent Labs Lab 09/16/16 1440 09/17/16 0432  NA 137 141  K 3.4* 3.2*  CL 98* 107  CO2 28 27  GLUCOSE 213* 142*  BUN 32* 15  CREATININE 0.49 0.53  CALCIUM 9.5 8.4*  AST 28  --   ALT 21  --   ALKPHOS 45  --   BILITOT 0.9  --    ------------------------------------------------------------------------------------------------------------------  Cardiac Enzymes No results for input(s): TROPONINI in the last 168 hours. ------------------------------------------------------------------------------------------------------------------  RADIOLOGY:  No results found.   ASSESSMENT AND PLAN:   Active Problems:   Ambulatory dysfunction   83 year old female patient with severe mental retardation brought in by family because of multiple falls, ambulated dysfunction: Physical therapy recommended SNIF placement. She is stable for discharge  #2 hypokalemia replaced.potassium by mouth #3 .sinus tachycardia on admission: Received IV hydration. #4 essential hypertension: Continue present medications, metoprolol dose decreased today because of follow-up soft blood  pressure but according to family metoprolol dose has been increased recently to 100 mg secondary to PVD/  5.DMII;continue present meds;.`resume lantus at discharge, sugars well controlled. Code;partial     All the records are reviewed and case discussed with Care Management/Social Workerr. Management plans discussed with the patient, family and they are in agreement.  CODE STATUS: partial(NO intubation but wants CPR)  TOTAL TIME TAKING CARE OF THIS PATIENT:35 minutes.   POSSIBLE D/C IN 1-2DAYS, DEPENDING ON CLINICAL CONDITION.   Epifanio Lesches M.D on 09/19/2016 at 12:22 PM  Between 7am to 6pm - Pager - [phone]  After 6pm go to [email or web address] - Acupuncturist  Hospitalists  Office  [phone]  CC: Primary care physician; Vista Mink, FNP

## 2016-09-19 NOTE — Clinical Social Work Note (Addendum)
CSW contacted [facility],  who said they did not receive the referral, spoke to [name] who requested CSW to fax refferral for patient again.  CSW faxed requested information, CSW awaiting call back from [facility].  1:00 pm CSW received call back from [facility], who said they can not accept patient to ALF.  CSW attempted to update patient's family, but there was no answer, CSW to continue ALF search.  4:00pm  CSW contacted patient's half-sister Jennifer Villarreal [phone] and informed her that [name] will not accept patient.  Patient's half sister said they are interested in considering long term care at [facility].  CSW was informed that patient has Medicaid and Medicare from patient's half-sister.  Patient's family said she was at [facility] in the past and they were pleased with care that was provided.  CSW contacted [facility] who will review patient's information in the morning and verify if she has Medicaid then contact CSW back.   Jones Broom. [place], MSW, [place]  09/19/2016 10:26 AM

## 2016-09-20 LAB — GLUCOSE, CAPILLARY: Glucose-Capillary: 107 mg/dL — ABNORMAL HIGH (ref 65–99)

## 2016-09-20 MED ORDER — BISACODYL 5 MG PO TBEC: 5.0000 mg | DELAYED_RELEASE_TABLET | Freq: Every day | ORAL | Status: DC | PRN

## 2016-09-20 NOTE — Progress Notes (Signed)
[facility] [place] NAME: Jennifer Villarreal    MR#:  [id number]  DATE OF BIRTH:  07-20-1931  SUBJECTIVE:admitted   For because of multiple falls.pt has severe mental retardation.  CHIEF COMPLAINT:   Chief Complaint  Patient presents with  . Weakness    REVIEW OF SYSTEMS:    Review of Systems  Unable to perform ROS: Mental acuity   Has severe mental retardation.  Review of systems because of mental status Nutrition:  Tolerating Diet: Tolerating PT:      DRUG ALLERGIES:  No Known Allergies  VITALS:  Blood pressure 137/61, pulse 86, temperature 98.1 F (36.7 C), temperature source Oral, resp. rate 16, height 5\' 1"  (1.549 m), weight 49.5 kg (109 lb 1.6 oz), SpO2 96 %.  PHYSICAL EXAMINATION:   Physical Exam  GENERAL:  83 y.o.-year-old patient lying in the bed with no acute distress.  EYES: Pupils equal, round, reactive to light and accommodation. No scleral icterus. Extraocular muscles intact.  HEENT: Head atraumatic, normocephalic. Oropharynx and nasopharynx clear.  NECK:  Supple, no jugular venous distention. No thyroid enlargement, no tenderness.  LUNGS: Normal breath sounds bilaterally, no wheezing, rales,rhonchi or crepitation. No use of accessory muscles of respiration.  CARDIOVASCULAR: S1, S2 normal. No murmurs, rubs, or gallops.  ABDOMEN: Soft, nontender, nondistended. Bowel sounds present. No organomegaly or mass.  EXTREMITIES: No pedal edema, cyanosis, or clubbing.  NEUROLOGIC: can not do neuro exam as she does not follow commands at baseline(Has IQ of 83 yr old)  PSYCHIATRIC: The patient is alert but  Does not talk and communicate much at baseline. SKIN: No obvious rash, lesion, or ulcer.    LABORATORY PANEL:   CBC  Recent Labs Lab 09/17/16 0432  WBC 4.8  HGB 11.3*  HCT 33.1*  PLT 273    ------------------------------------------------------------------------------------------------------------------  Chemistries   Recent Labs Lab 09/16/16 1440 09/17/16 0432  NA 137 141  K 3.4* 3.2*  CL 98* 107  CO2 28 27  GLUCOSE 213* 142*  BUN 32* 15  CREATININE 0.49 0.53  CALCIUM 9.5 8.4*  AST 28  --   ALT 21  --   ALKPHOS 45  --   BILITOT 0.9  --    ------------------------------------------------------------------------------------------------------------------  Cardiac Enzymes No results for input(s): TROPONINI in the last 168 hours. ------------------------------------------------------------------------------------------------------------------  RADIOLOGY:  No results found.   ASSESSMENT AND PLAN:   Active Problems:   Ambulatory dysfunction   83 year old female patient with severe mental retardation brought in by family because of multiple falls, ambulated dysfunction: Physical therapy recommended SNIF placement. She is stable for discharge .waiting for placement. #2 hypokalemia replaced.potassium by mouth #3 .sinus tachycardia on admission: Received IV hydration.on bblockers #4 essential hypertension: Continue present medications,  5.DMII;continue metformin and amaryl. Code;partial     All the records are reviewed and case discussed with Care Management/Social Workerr. Management plans discussed with the patient, family and they are in agreement.  CODE STATUS: partial(NO intubation but wants CPR)  TOTAL TIME TAKING CARE OF THIS PATIENT:35 minutes.   POSSIBLE D/C IN 1-2DAYS, DEPENDING ON CLINICAL CONDITION.   Epifanio Lesches M.D on 09/20/2016 at 6:21 PM  Between 7am to 6pm - Pager - [phone]  After 6pm go to [email or web address] - password [email or web address] [facility]  Office  [phone]  CC: Primary care physician; Vista Mink, FNP

## 2016-09-20 NOTE — Progress Notes (Signed)
Physical Therapy Treatment Patient Details Name: Jennifer Villarreal MRN: [id number] DOB: Feb 13, 1931 Today's Date: 09/20/2016    History of Present Illness 83 yo female with severe MR has been admitted with falls and unsteady gait with dizziness, PMHx:  osteopenia, L hemiarthroplasty, MR, DM, HLD, GERD, PE, HTN    PT Comments    Pt is making good progress towards goals, however still remains deconditioned. Pt largely non verbal, however is able to nod/shake head in response to questions. Good endurance with there-ex and pt able to participate with ambulation in room. Needs assist for all mobility secondary to unsteadiness. Will continue to progress.  Follow Up Recommendations  SNF     Equipment Recommendations       Recommendations for Other Services       Precautions / Restrictions Precautions Precautions: Fall Restrictions Weight Bearing Restrictions: No    Mobility  Bed Mobility Overal bed mobility: Needs Assistance Bed Mobility: Supine to Sit     Supine to sit: Min assist     General bed mobility comments: Needs cues for sliding B LEs off bed. Once seated at EOB, able to sit with upright posture.  Transfers Overall transfer level: Needs assistance Equipment used: Rolling walker (2 wheeled) Transfers: Sit to/from Stand Sit to Stand: Min assist         General transfer comment: Once standing, safe technique used with RW. Upright posture noted.  Ambulation/Gait Ambulation/Gait assistance: Min assist Ambulation Distance (Feet): 20 Feet Assistive device: Rolling walker (2 wheeled) Gait Pattern/deviations: Step-to pattern     General Gait Details: ambulated in room with step to gait pattern. Pt needs assistance maintaining balance during turns. Safe technique performed   Stairs            Wheelchair Mobility    Modified Rankin (Stroke Patients Only)       Balance                                    Cognition Arousal/Alertness:  Awake/alert Behavior During Therapy: Flat affect Overall Cognitive Status: History of cognitive impairments - at baseline                      Exercises Other Exercises Other Exercises: Supine ther-ex performed including ankle pumps, hip add squeezes, SLRs, heel slides, hip abd/add, and SAQ. All ther-ex performed x 10 reps with cga and cues for correct technique.    General Comments        Pertinent Vitals/Pain Pain Assessment: No/denies pain    Home Living                      Prior Function            PT Goals (current goals can now be found in the care plan section) Acute Rehab PT Goals Patient Stated Goal: none stated PT Goal Formulation: Patient unable to participate in goal setting Time For Goal Achievement: 10/01/16 Potential to Achieve Goals: Fair Progress towards PT goals: Progressing toward goals    Frequency    Min 2X/week      PT Plan Current plan remains appropriate    Co-evaluation             End of Session Equipment Utilized During Treatment: Gait belt Activity Tolerance: Patient limited by fatigue Patient left: in chair;with chair alarm set     Time: [id number] PT Time  Calculation (min) (ACUTE ONLY): 23 min  Charges:  $Gait Training: 8-22 mins $Therapeutic Exercise: 8-22 mins                    G Codes:      Ceaser Ebeling Sep 23, 2016, 11:40 AM  Greggory Stallion, PT, DPT [phone]

## 2016-09-20 NOTE — Clinical Social Work Note (Addendum)
CSW received phone call from [facility], who are going to verify that patient has Medicaid due to patient's family wanting long term care for patient.  CSW awaiting Medicaid verification, CSW to continue to follow patient's progress throughout discharge planning.  3:30pm  CSW spoke to [facility] care who said they are still trying to verify patient has Medicaid.  [facility] said they have contacted the DSS worker and are waiting for a response back to confirm patient's Medicaid benefits.  4:30pm  CSW contacted patient's sister to update her that [facility] has not been able to confirm patient's Medicaid benefits yet.  CSW continuing to follow patient's progress throughout discharge planning.  Jones Broom. [place], MSW, [place]  09/20/2016 11:08 AM

## 2016-09-20 NOTE — Discharge Summary (Signed)
Jennifer Villarreal, is a 83 y.o. female  DOB 1931-02-28  MRN [id number].  Admission date:  09/16/2016  Admitting Physician  Epifanio Lesches, MD  Discharge Date:  09/20/2016   Primary MD  Vista Mink, FNP  Recommendations for primary care physician for things to follow:   Follow-up with primary doctor in 1 month.   Admission Diagnosis  Anorexia [R63.0] Dehydration [E86.0] Sinus tachycardia [R00.0] Inability to walk [R26.2] Fall, initial encounter [W19.XXXA] Altered mental status, unspecified altered mental status type [R41.82]   Discharge Diagnosis  Anorexia [R63.0] Dehydration [E86.0] Sinus tachycardia [R00.0] Inability to walk [R26.2] Fall, initial encounter [W19.XXXA] Altered mental status, unspecified altered mental status type [R41.82]    Active Problems:   Ambulatory dysfunction      Past Medical History:  Diagnosis Date  . Acute pulmonary embolism ([place]) 10/27/2011  . Diabetes ([place]) 07/22/2015  . GERD (gastroesophageal reflux disease) 07/22/2015  . HTN (hypertension) 07/22/2015  . Hyperlipemia 07/22/2015  . Mental retardation 07/22/2015    History reviewed. No pertinent surgical history.     History of present illness and  Hospital Course:     Kindly see H&P for history of present illness and admission details, please review complete Labs, Consult reports and Test reports for all details in brief  HPI  from the history and physical done on the day of admission 83 year old female patient with severe mental retardation admitted because of the falls, unsteady gait.  Hospital Course  Multiple falls, unsteady gait: Patient has severe mental retardation, patient is taken care of by older sister was severe Parkinson disease. Patient can no longer go home, patient needs placement, we are waiting for  arrangements for discharge.  #2. Diabetes mellitus type 2: Continue Lantus. Amaryl. #3 hyperlipidemia: Continue statins #4 essential hypertension: Patient had bloody with heart rate up to 1 14 bpm and did receive gentle hydration, continued on bblocker.     Discharge Condition:stable   Follow UP      Discharge Instructions  and  Discharge Medications      Allergies as of 09/20/2016   No Known Allergies     Medication List    STOP taking these medications   hydrochlorothiazide 25 MG tablet Commonly known as:  HYDRODIURIL   LANTUS SOLOSTAR 100 UNIT/ML Solostar Pen Generic drug:  Insulin Glargine Replaced by:  insulin glargine 100 UNIT/ML injection     TAKE these medications   aspirin 325 MG EC tablet Take 325 mg by mouth daily.   atorvastatin 20 MG tablet Commonly known as:  LIPITOR Take 1 tablet by mouth at bedtime.   donepezil 5 MG tablet Commonly known as:  ARICEPT Take 1 tablet by mouth daily.   glimepiride 4 MG tablet Commonly known as:  AMARYL Take 1 tablet by mouth 2 (two) times daily.   insulin glargine 100 UNIT/ML injection Commonly known as:  LANTUS Inject 0.1 mLs (10 Units total) into the skin at bedtime. Replaces:  LANTUS SOLOSTAR 100 UNIT/ML Solostar Pen   LORazepam 1 MG tablet Commonly known as:  ATIVAN Take 1 tablet by mouth 2 (two) times daily.   metFORMIN 1000 MG tablet Commonly known as:  GLUCOPHAGE Take 1 tablet by mouth 2 (two) times daily.   metoprolol succinate 50 MG 24 hr tablet Commonly known as:  TOPROL-XL Take 100 mg by mouth daily.   omeprazole 20 MG capsule Commonly known as:  PRILOSEC Take 1 capsule by mouth 2 (two) times daily.   valsartan 40 MG tablet Commonly  known as:  DIOVAN Take 1 tablet by mouth daily.         Diet and Activity recommendation: See Discharge Instructions above   Consults obtained - PT   Major procedures and Radiology Reports - PLEASE review detailed and final reports for all  details, in brief -      Ct Angio Head W Or Wo Contrast  Result Date: 09/16/2016 CLINICAL DATA:  Dizziness.  Fall.  Unsteady gait. EXAM: CT ANGIOGRAPHY HEAD AND NECK TECHNIQUE: Multidetector CT imaging of the head and neck was performed using the standard protocol during bolus administration of intravenous contrast. Multiplanar CT image reconstructions and MIPs were obtained to evaluate the vascular anatomy. Carotid stenosis measurements (when applicable) are obtained utilizing NASCET criteria, using the distal internal carotid diameter as the denominator. CONTRAST:  75 mL Isovue 370 IV COMPARISON:  CT head 05/04/2011 FINDINGS: CT HEAD FINDINGS Brain: Moderate atrophy. Mild chronic microvascular ischemic change in the white matter. Negative for acute infarct. Negative for hemorrhage or mass. Vascular: No hyperdense vessel or unexpected calcification. Skull: Negative for fracture. Sinuses: Mild mucosal edema in the paranasal sinuses. Orbits: Bilateral ocular surgery.  Negative for mass in the orbit. Review of the MIP images confirms the above findings CTA NECK FINDINGS Aortic arch: Mild atherosclerotic calcification in the aortic arch. Proximal great vessels patent. Right carotid system: Right carotid bifurcation widely patent without stenosis or dissection Left carotid system: Mild atherosclerotic plaque at the left carotid bifurcation without significant stenosis or dissection. Vertebral arteries: Calcific plaque at the origin of the left vertebral artery causing moderate stenosis. Left vertebral artery is patent to the basilar. Right vertebral artery widely patent throughout its course. 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 Multilevel lumbar disc degeneration and spurring. No acute skeletal abnormality. Other neck: Negative for mass or adenopathy in the neck. Upper chest: Lung apices clear. Review of the MIP images confirms the above findings CTA HEAD FINDINGS Anterior circulation:  Mild atherosclerotic calcification in the cavernous carotid on the right. No significant carotid stenosis. Anterior and middle cerebral arteries widely patent Posterior circulation: Both vertebral arteries patent to the basilar. Left PICA patent. Right PICA not visualized. AICA, superior cerebellar, and posterior cerebral arteries patent bilaterally. Basilar normal. Venous sinuses: Patent Anatomic variants: Negative for cerebral aneurysm Delayed phase: Normal enhancement on delayed imaging. Review of the MIP images confirms the above findings IMPRESSION: Atrophy and chronic microvascular ischemia. No acute intracranial abnormality. No significant carotid stenosis. Moderate stenosis at the origin of left vertebral artery due to calcific plaque. Electronically Signed   By: Franchot Gallo M.D.   On: 09/16/2016 17:14   Dg Chest 2 View  Result Date: 09/16/2016 CLINICAL DATA:  Falls.  Unsteady gait.  Initial encounter. EXAM: CHEST  2 VIEW COMPARISON:  07/25/2012 FINDINGS: Normal heart size. Mitral annular ossification. Stable mild aortic tortuosity. Aortic atherosclerosis. There is no edema, consolidation, effusion, or pneumothorax. Osteopenia. No acute osseous finding. IMPRESSION: No active cardiopulmonary disease. Electronically Signed   By: Monte Fantasia M.D.   On: 09/16/2016 18:27   Ct Angio Neck W And/or Wo Contrast  Result Date: 09/16/2016 CLINICAL DATA:  Dizziness.  Fall.  Unsteady gait. EXAM: CT ANGIOGRAPHY HEAD AND NECK TECHNIQUE: Multidetector CT imaging of the head and neck was performed using the standard protocol during bolus administration of intravenous contrast. Multiplanar CT image reconstructions and MIPs were obtained to evaluate the vascular anatomy. Carotid stenosis measurements (when applicable) are obtained utilizing NASCET criteria, using the distal internal  carotid diameter as the denominator. CONTRAST:  75 mL Isovue 370 IV COMPARISON:  CT head 05/04/2011 FINDINGS: CT HEAD FINDINGS Brain:  Moderate atrophy. Mild chronic microvascular ischemic change in the white matter. Negative for acute infarct. Negative for hemorrhage or mass. Vascular: No hyperdense vessel or unexpected calcification. Skull: Negative for fracture. Sinuses: Mild mucosal edema in the paranasal sinuses. Orbits: Bilateral ocular surgery.  Negative for mass in the orbit. Review of the MIP images confirms the above findings CTA NECK FINDINGS Aortic arch: Mild atherosclerotic calcification in the aortic arch. Proximal great vessels patent. Right carotid system: Right carotid bifurcation widely patent without stenosis or dissection Left carotid system: Mild atherosclerotic plaque at the left carotid bifurcation without significant stenosis or dissection. Vertebral arteries: Calcific plaque at the origin of the left vertebral artery causing moderate stenosis. Left vertebral artery is patent to the basilar. Right vertebral artery widely patent throughout its course. 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 Multilevel lumbar disc degeneration and spurring. No acute skeletal abnormality. Other neck: Negative for mass or adenopathy in the neck. Upper chest: Lung apices clear. Review of the MIP images confirms the above findings CTA HEAD FINDINGS Anterior circulation: Mild atherosclerotic calcification in the cavernous carotid on the right. No significant carotid stenosis. Anterior and middle cerebral arteries widely patent Posterior circulation: Both vertebral arteries patent to the basilar. Left PICA patent. Right PICA not visualized. AICA, superior cerebellar, and posterior cerebral arteries patent bilaterally. Basilar normal. Venous sinuses: Patent Anatomic variants: Negative for cerebral aneurysm Delayed phase: Normal enhancement on delayed imaging. Review of the MIP images confirms the above findings IMPRESSION: Atrophy and chronic microvascular ischemia. No acute intracranial abnormality. No significant carotid  stenosis. Moderate stenosis at the origin of left vertebral artery due to calcific plaque. Electronically Signed   By: Franchot Gallo M.D.   On: 09/16/2016 17:14   Dg Hips Bilat W Or Wo Pelvis 3-4 Views  Result Date: 09/16/2016 CLINICAL DATA:  Dizziness and fall. Per [facility], patient comes from home after a fall she had yesterday. Patient hit her head. Family called EMS yesterday then refused transport. Today, patient woke up with unsteady gait, "more than normal". EXAM: DG HIP (WITH OR WITHOUT PELVIS) 3-4V BILAT COMPARISON:  None. FINDINGS: No evidence for acute fracture or subluxation. Left hip hemiarthroplasty is well seated. Contrast is identified the distal ureters and urinary bladder following contrast laceration. Coarse calcification in the central pelvis is consistent with a fibroid. Bones appear osteopenic. IMPRESSION: No evidence for acute  abnormality. Electronically Signed   By: Nolon Nations M.D.   On: 09/16/2016 17:10    Micro Results     No results found for this or any previous visit (from the past 240 hour(s)).     Today   Subjective:   Landry Mellow today ,stable for discharge. Objective:   Blood pressure (!) 126/56, pulse 88, temperature 97.9 F (36.6 C), temperature source Oral, resp. rate 16, height 5\' 1"  (1.549 m), weight 49.5 kg (109 lb 1.6 oz), SpO2 96 %.   Intake/Output Summary (Last 24 hours) at 09/20/16 1147 Last data filed at 09/20/16 0052  Gross per 24 hour  Intake              220 ml  Output                1 ml  Net              219 ml    Exam Awake ,  No new F.N deficits, Normal affect [place].AT,PERRAL Supple Neck,No JVD, No cervical lymphadenopathy appriciated.  Symmetrical Chest wall movement, Good air movement bilaterally, CTAB RRR,No Gallops,Rubs or new Murmurs, No Parasternal Heave +ve B.Sounds, Abd Soft, Non tender, No organomegaly appriciated, No rebound -guarding or rigidity. No Cyanosis, Clubbing or edema, No new Rash or bruise  Data Review    CBC w Diff: Lab Results  Component Value Date   WBC 4.8 09/17/2016   HGB 11.3 (L) 09/17/2016   HGB 13.4 07/25/2012   HCT 33.1 (L) 09/17/2016   HCT 39.4 07/25/2012   PLT 273 09/17/2016   PLT 324 07/25/2012   LYMPHOPCT 27.3 10/28/2011   MONOPCT 9.2 10/28/2011   EOSPCT 1.6 10/28/2011   BASOPCT 0.4 10/28/2011    CMP: Lab Results  Component Value Date   NA 141 09/17/2016   NA 139 07/25/2012   K 3.2 (L) 09/17/2016   K 3.9 07/25/2012   CL 107 09/17/2016   CL 99 07/25/2012   CO2 27 09/17/2016   CO2 32 07/25/2012   BUN 15 09/17/2016   BUN 12 07/25/2012   CREATININE 0.53 09/17/2016   CREATININE 0.79 07/25/2012   PROT 7.1 09/16/2016   PROT 7.6 07/25/2012   ALBUMIN 3.8 09/16/2016   ALBUMIN 3.9 07/25/2012   BILITOT 0.9 09/16/2016   BILITOT 0.3 07/25/2012   ALKPHOS 45 09/16/2016   ALKPHOS 56 07/25/2012   AST 28 09/16/2016   AST 24 07/25/2012   ALT 21 09/16/2016   ALT 25 07/25/2012  .   Total Time in preparing paper work, data evaluation and todays exam - 6 minutes  Hafsah Hendler M.D on 09/20/2016 at 11:47 AM    Note: This dictation was prepared with Dragon dictation along with smaller phrase technology. Any transcriptional errors that result from this process are unintentional.

## 2016-09-21 LAB — GLUCOSE, CAPILLARY: Glucose-Capillary: 78 mg/dL (ref 65–99)

## 2016-09-21 NOTE — Progress Notes (Signed)
Report called to [place], Therapist, sports at [facility] and EMS contacted for transport.  Clarise Cruz, RN

## 2016-09-21 NOTE — Clinical Social Work Placement (Signed)
   CLINICAL SOCIAL WORK PLACEMENT  NOTE  Date:  09/21/2016  Patient Details  Name: Jennifer Villarreal MRN: [id number] Date of Birth: [birth year]  Clinical Social Work is seeking post-discharge placement for this patient at the [place] level of care (*CSW will initial, date and re-position this form in  chart as items are completed):  Yes   Patient/family provided with [place] Work Department's list of facilities offering this level of care within the geographic area requested by the patient (or if unable, by the patient's family).  Yes   Patient/family informed of their freedom to choose among providers that offer the needed level of care, that participate in Medicare, Medicaid or managed care program needed by the patient, have an available bed and are willing to accept the patient.  Yes   Patient/family informed of [facility]'s ownership interest in [facility] and [facility], as well as of the fact that they are under no obligation to receive care at these facilities.  PASRR submitted to EDS on 09/19/16     PASRR number received on       Existing PASRR number confirmed on 09/19/16     FL2 transmitted to all facilities in geographic area requested by pt/family on 09/19/16     FL2 transmitted to all facilities within larger geographic area on       Patient informed that his/her managed care company has contracts with or will negotiate with certain facilities, including the following:        Yes   Patient/family informed of bed offers received.  Patient chooses bed at [facility]     Physician recommends and patient chooses bed at      Patient to be transferred to [facility] on 09/21/16.  Patient to be transferred to facility by [facility]     Patient family notified on 09/21/16 of transfer.  Name of family member notified:  Tori Milks, patient's sister.     PHYSICIAN Please sign FL2      Additional Comment:    _______________________________________________ Ross Ludwig, LCSWA 09/21/2016, 11:09 AM

## 2016-09-21 NOTE — Care Management (Signed)
Patient placed in observation for weakness 2/2.Marland Kitchen  Physical therapy has recommended skilled nursing facility placement.  CSW is attempting to find long term placement in a skilled facility.  Patient's family members  are no longer able to meet care demands.  Patient with significant MR.

## 2016-09-21 NOTE — Progress Notes (Signed)
Pt transported via EMS.  Pt's sister made aware.

## 2016-09-21 NOTE — NC FL2 (Signed)
[place] LEVEL OF CARE SCREENING TOOL     IDENTIFICATION  Patient Name: Jennifer Villarreal Birthdate: [birth year] Sex: female Admission Date (Current Location): 09/16/2016  [place] and [place] Number:  Engineering geologist and Address:  [facility], [address] [place], [place] [address]      Provider Number: [id number]  Attending Physician Name and Address:  Loletha Grayer, MD  Relative Name and Phone Number:       Current Level of Care: Hospital Recommended Level of Care: [place] Prior Approval Number:    Date Approved/Denied:   PASRR Number: [id number]  Discharge Plan: SNF    Current Diagnoses: Patient Active Problem List   Diagnosis Date Noted  . Ambulatory dysfunction 09/16/2016  . Squamous cell carcinoma of right lower leg 07/24/2015  . Diabetes ([place]) 07/22/2015  . Hyperlipemia 07/22/2015  . HTN (hypertension) 07/22/2015  . GERD (gastroesophageal reflux disease) 07/22/2015  . Mental retardation 07/22/2015  . Acute pulmonary embolism ([place]) 10/27/2011    Orientation RESPIRATION BLADDER Height & Weight     Self Normal Continent Weight: 109 lb (49.4 kg) Height:  5\' 1"  (154.9 cm)  BEHAVIORAL SYMPTOMS/MOOD NEUROLOGICAL BOWEL NUTRITION STATUS      Continent Diet (Cardiac diet)  AMBULATORY STATUS COMMUNICATION OF NEEDS Skin   Extensive Assist Verbally Normal                       Personal Care Assistance Level of Assistance  Bathing, Feeding, Dressing Bathing Assistance: Limited assistance Feeding assistance: Independent Dressing Assistance: Limited assistance     Functional Limitations Info  Sight, Hearing, Speech Sight Info: Adequate Hearing Info: Adequate Speech Info: Adequate    SPECIAL CARE FACTORS FREQUENCY  PT (By licensed PT)     PT Frequency: 5x a week              Contractures Contractures Info: Present    Additional Factors Info  Code Status Code Status Info: Partial:  DNI, full otherwise             Current Medications (09/21/2016):  This is the current hospital active medication list Current Facility-Administered Medications  Medication Dose Route Frequency Provider Last Rate Last Dose  . acetaminophen (TYLENOL) tablet 650 mg  650 mg Oral Q6H PRN Epifanio Lesches, MD       Or  . acetaminophen (TYLENOL) suppository 650 mg  650 mg Rectal Q6H PRN Epifanio Lesches, MD      . atorvastatin (LIPITOR) tablet 20 mg  20 mg Oral QHS Epifanio Lesches, MD   20 mg at 09/20/16 2247  . bisacodyl (DULCOLAX) EC tablet 5 mg  5 mg Oral Daily PRN Epifanio Lesches, MD      . docusate sodium (COLACE) capsule 100 mg  100 mg Oral BID Epifanio Lesches, MD   100 mg at 09/20/16 2247  . enoxaparin (LOVENOX) injection 40 mg  40 mg Subcutaneous Q24H Epifanio Lesches, MD   40 mg at 09/20/16 2248  . glimepiride (AMARYL) tablet 4 mg  4 mg Oral BID Epifanio Lesches, MD   4 mg at 09/20/16 2247  . irbesartan (AVAPRO) tablet 37.5 mg  37.5 mg Oral Daily Epifanio Lesches, MD   37.5 mg at 09/20/16 0913  . LORazepam (ATIVAN) tablet 0.5 mg  0.5 mg Oral QHS Epifanio Lesches, MD   0.5 mg at 09/20/16 2247  . metFORMIN (GLUCOPHAGE) tablet 1,000 mg  1,000 mg Oral BID WC Epifanio Lesches, MD  1,000 mg at 09/20/16 1610  . metoprolol succinate (TOPROL-XL) 24 hr tablet 100 mg  100 mg Oral Daily Epifanio Lesches, MD   100 mg at 09/20/16 0914  . ondansetron (ZOFRAN) tablet 4 mg  4 mg Oral Q6H PRN Epifanio Lesches, MD       Or  . ondansetron (ZOFRAN) injection 4 mg  4 mg Intravenous Q6H PRN Epifanio Lesches, MD      . pantoprazole (PROTONIX) EC tablet 40 mg  40 mg Oral Daily Epifanio Lesches, MD   40 mg at 09/20/16 0916  . potassium chloride SA (K-DUR,KLOR-CON) CR tablet 10 mEq  10 mEq Oral Daily Epifanio Lesches, MD   10 mEq at 09/20/16 [id number]     Discharge Medications: Please see discharge summary for a list of discharge medications.  Relevant Imaging  Results:  Relevant Lab Results:   Additional Information SS# [id number]  Anell Barr

## 2016-09-21 NOTE — Clinical Social Work Note (Addendum)
CSW received phone call from [facility] who said they can accept patient today, CSW will updated patient's family and physician.  Patient to be d/c'ed today to [facility].  Patient and family agreeable to plans will transport via ems RN to call report to [phone] room 27A.  Jones Broom. [place], MSW, [place]  09/21/2016 10:30 AM

## 2016-09-21 NOTE — Progress Notes (Signed)
Patient ID: Jennifer Villarreal, female   DOB: 05-21-1931, 83 y.o.   MRN: [id number]  [employer] PROGRESS NOTE  Jennifer Villarreal [id number] DOB: [birth year] DOA: 09/16/2016 PCP: Vista Mink, FNP  HPI/Subjective: [name]. Feels alright.  Objective: Vitals:   09/20/16 2005 09/21/16 0429  BP: (!) 156/67 (!) 140/54  Pulse: 78 84  Resp: 20 20  Temp: 97.9 F (36.6 C) 98.3 F (36.8 C)    Filed Weights   09/19/16 0500 09/20/16 0500 09/21/16 0500  Weight: 49.3 kg (108 lb 9.6 oz) 49.5 kg (109 lb 1.6 oz) 49.4 kg (109 lb)    ROS: Review of Systems  Constitutional: Negative for fever.  Eyes: Negative for blurred vision.  Respiratory: Negative for cough and shortness of breath.   Cardiovascular: Negative for chest pain.  Gastrointestinal: Negative for abdominal pain, diarrhea, nausea and vomiting.  Genitourinary: Negative for dysuria.  Musculoskeletal: Negative for joint pain.  Neurological: Negative for dizziness.   Exam: Physical Exam  HENT:  Nose: No mucosal edema.  Mouth/Throat: No oropharyngeal exudate or posterior oropharyngeal edema.  Eyes: Conjunctivae, EOM and lids are normal. Pupils are equal, round, and reactive to light.  Neck: No JVD present. Carotid bruit is not present. No edema present. No thyroid mass and no thyromegaly present.  Cardiovascular: S1 normal and S2 normal.  Exam reveals no gallop.   Murmur heard.  Systolic murmur is present with a grade of 3/6  Pulses:      Dorsalis pedis pulses are 2+ on the right side, and 2+ on the left side.  Respiratory: No respiratory distress. She has no wheezes. She has no rhonchi. She has no rales.  GI: Soft. Bowel sounds are normal. There is no tenderness.  Musculoskeletal:       Right ankle: She exhibits no swelling.       Left ankle: She exhibits swelling.  Lymphadenopathy:    She has no cervical adenopathy.  Neurological: She is alert.  Skin: Skin is warm. Nails show no clubbing.   Left hand covered with bandage  Psychiatric: She has a normal mood and affect.      Data Reviewed: Basic Metabolic Panel:  Recent Labs Lab 09/16/16 1440 09/17/16 0432  NA 137 141  K 3.4* 3.2*  CL 98* 107  CO2 28 27  GLUCOSE 213* 142*  BUN 32* 15  CREATININE 0.49 0.53  CALCIUM 9.5 8.4*   Liver Function Tests:  Recent Labs Lab 09/16/16 1440  AST 28  ALT 21  ALKPHOS 45  BILITOT 0.9  PROT 7.1  ALBUMIN 3.8   CBC:  Recent Labs Lab 09/16/16 2016 09/17/16 0432  WBC 4.9 4.8  HGB 12.2 11.3*  HCT 35.7 33.1*  MCV 86.8 86.2  PLT 283 273    CBG:  Recent Labs Lab 09/17/16 0731 09/18/16 0740 09/19/16 0755 09/20/16 0750 09/21/16 0727  GLUCAP 145* 152* 147* 107* 78     Scheduled Meds: . atorvastatin  20 mg Oral QHS  . docusate sodium  100 mg Oral BID  . enoxaparin (LOVENOX) injection  40 mg Subcutaneous Q24H  . glimepiride  4 mg Oral BID  . irbesartan  37.5 mg Oral Daily  . LORazepam  0.5 mg Oral QHS  . metFORMIN  1,000 mg Oral BID WC  . metoprolol succinate  100 mg Oral Daily  . pantoprazole  40 mg Oral Daily  . potassium chloride  10 mEq Oral Daily   Continuous Infusions:  Assessment/Plan:  1.  Unsteady gait/ ambulatory dysfunction. Social working working on bed placement for today 2. hypokalemia- replaced 3. Essential htn- continue usual medications 4. Type 2 diabtetes withou complication. Continue oral medications 5. Hyperlipidemia unspecified on atovostatin  Code Status:     Code Status Orders        Start     Ordered   09/16/16 2038  Limited resuscitation (code)  Continuous    Question Answer Comment  In the event of cardiac or respiratory ARREST: Initiate Code Blue, Call Rapid Response Yes   In the event of cardiac or respiratory ARREST: Perform CPR Yes   In the event of cardiac or respiratory ARREST: Perform Intubation/Mechanical Ventilation No   In the event of cardiac or respiratory ARREST: Use NIPPV/BiPAp only if indicated Yes    In the event of cardiac or respiratory ARREST: Administer ACLS medications if indicated Yes   In the event of cardiac or respiratory ARREST: Perform Defibrillation or Cardioversion if indicated Yes      09/16/16 2039    Code Status History    Date Active Date Inactive Code Status Order ID Comments User Context   09/16/2016  6:26 PM 09/16/2016  8:39 PM DNR [id number]  Epifanio Lesches, MD ED    Advance Directive Documentation   Flowsheet Row Most Recent Value  Type of Advance Directive  Healthcare Power of [place]  Pre-existing out of facility DNR order (yellow form or pink MOST form)  No data  "MOST" Form in Place?  No data      Disposition Plan: rehab  Time spent: 20 minutes  Loletha Grayer  [employer]

## 2016-12-13 DEATH — deceased

## 2017-11-19 IMAGING — CT CT ANGIO NECK
2 of 11 series · 6 of 35 positions shown · IV contrast (isovue)
Comparison: CT head 05/04/2011

CLINICAL DATA: Dizziness.  Fall.  Unsteady gait.

EXAM:
CT ANGIOGRAPHY HEAD AND NECK
TECHNIQUE: Multidetector CT imaging of the head and neck was performed using
the standard protocol during bolus administration of intravenous
contrast. Multiplanar CT image reconstructions and MIPs were
obtained to evaluate the vascular anatomy. Carotid stenosis
measurements (when applicable) are obtained utilizing NASCET
criteria, using the distal internal carotid diameter as the
denominator.
CONTRAST:  75 mL Isovue 370 IV

[Series 12: ax thin · axial · 0.39mm/px · z∈[-270,-56]mm · 5 of 323 slices shown]
[im 54/323  soft-tissue]
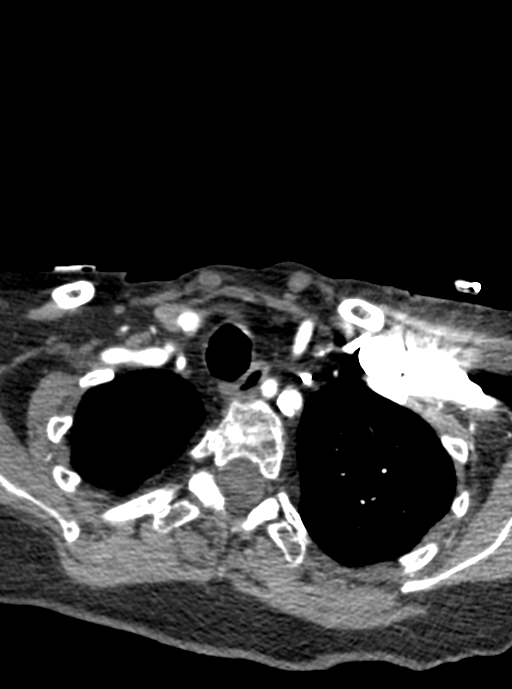
[im 108/323  bone]
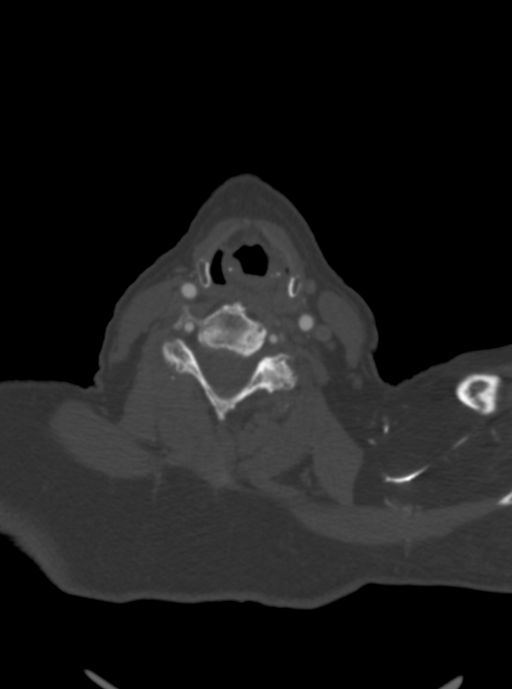
[im 162/323  soft-tissue]
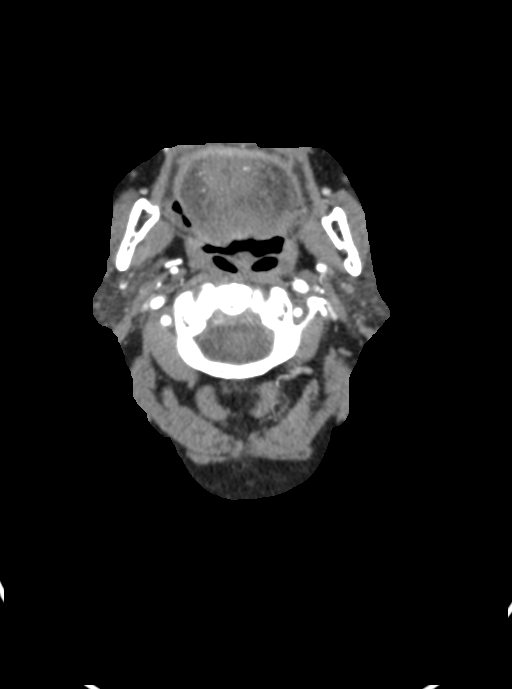
[im 215/323  bone]
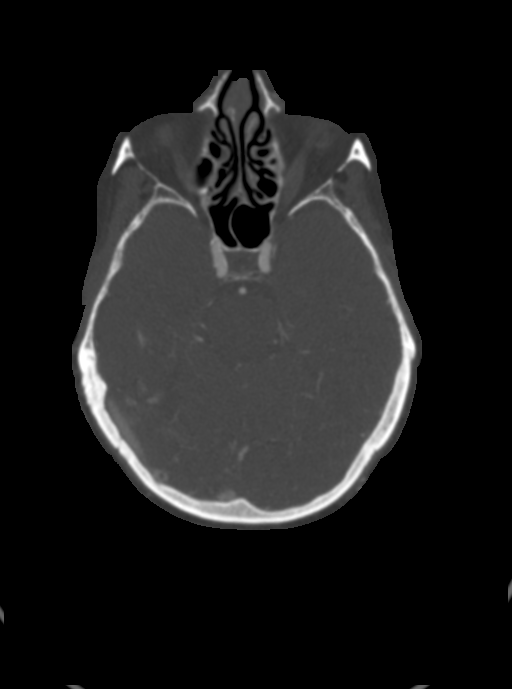
[im 269/323  soft-tissue]
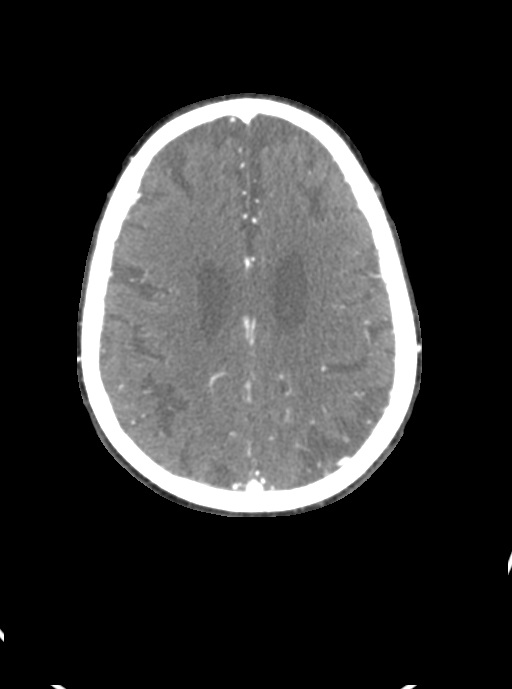

[Series 14: sagittal thin · sagittal · 0.56mm/px · 1 of 201 slices shown]
[im 127/201  soft-tissue]
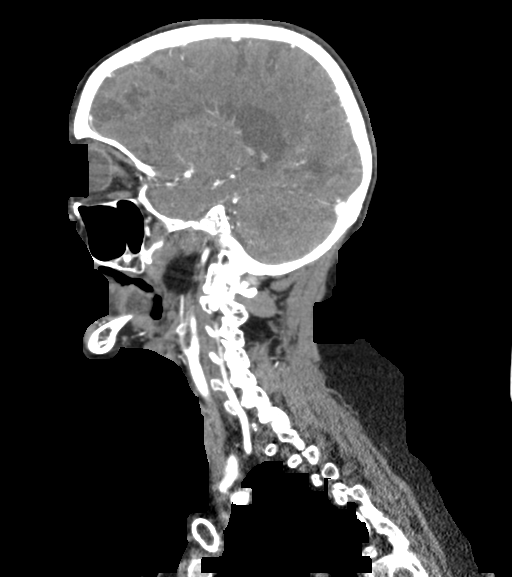

[6 of 35 positions shown; findings below may reference images not displayed]

FINDINGS: CT HEAD FINDINGS

Brain: Moderate atrophy. Mild chronic microvascular ischemic change
in the white matter. Negative for acute infarct. Negative for
hemorrhage or mass.

Vascular: No hyperdense vessel or unexpected calcification.

Skull: Negative for fracture.

Sinuses: Mild mucosal edema in the paranasal sinuses.

Orbits: Bilateral ocular surgery.  Negative for mass in the orbit.

Review of the MIP images confirms the above findings

CTA NECK FINDINGS

Aortic arch: Mild atherosclerotic calcification in the aortic arch.
Proximal great vessels patent.

Right carotid system: Right carotid bifurcation widely patent
without stenosis or dissection

Left carotid system: Mild atherosclerotic plaque at the left carotid
bifurcation without significant stenosis or dissection.

Vertebral arteries: Calcific plaque at the origin of the left
vertebral artery causing moderate stenosis. Left vertebral artery is
patent to the basilar. Right vertebral artery widely patent
throughout its course.

Skeleton: Congenital fusion of C2 and C3 with levoscoliosis.
Multilevel lumbar disc degeneration and spurring. No acute skeletal
abnormality.

Other neck: Negative for mass or adenopathy in the neck.

Upper chest: Lung apices clear.

Review of the MIP images confirms the above findings

CTA HEAD FINDINGS

Anterior circulation: Mild atherosclerotic calcification in the
cavernous carotid on the right. No significant carotid stenosis.
Anterior and middle cerebral arteries widely patent

Posterior circulation: Both vertebral arteries patent to the
basilar. Left PICA patent. Right PICA not visualized. AICA, superior
cerebellar, and posterior cerebral arteries patent bilaterally.
Basilar normal.

Venous sinuses: Patent

Anatomic variants: Negative for cerebral aneurysm

Delayed phase: Normal enhancement on delayed imaging.

Review of the MIP images confirms the above findings
IMPRESSION: Atrophy and chronic microvascular ischemia. No acute intracranial
abnormality.

No significant carotid stenosis. Moderate stenosis at the origin of
left vertebral artery due to calcific plaque.
